# Patient Record
Sex: Female | Born: 2005 | Race: White | Hispanic: No | Marital: Single | Smoking: Never smoker
Health system: Southern US, Community
[De-identification: ages and names within clinical notes are randomized; demographics above are authoritative.]

## PROBLEM LIST (undated history)

## (undated) DIAGNOSIS — F32A Depression, unspecified: Secondary | ICD-10-CM

## (undated) DIAGNOSIS — S82891A Other fracture of right lower leg, initial encounter for closed fracture: Secondary | ICD-10-CM

## (undated) DIAGNOSIS — F419 Anxiety disorder, unspecified: Secondary | ICD-10-CM

## (undated) DIAGNOSIS — J45909 Unspecified asthma, uncomplicated: Secondary | ICD-10-CM

## (undated) HISTORY — DX: Other fracture of right lower leg, initial encounter for closed fracture: S82.891A

## (undated) HISTORY — DX: Depression, unspecified: F32.A

## (undated) HISTORY — DX: Anxiety disorder, unspecified: F41.9

## (undated) HISTORY — DX: Unspecified asthma, uncomplicated: J45.909

---
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
## 2023-02-20 DIAGNOSIS — E66811 Obesity, class 1: Secondary | ICD-10-CM | POA: Insufficient documentation

## 2023-02-20 DIAGNOSIS — H5213 Myopia, bilateral: Secondary | ICD-10-CM | POA: Diagnosis not present

## 2023-02-20 DIAGNOSIS — N921 Excessive and frequent menstruation with irregular cycle: Secondary | ICD-10-CM | POA: Insufficient documentation

## 2023-02-20 DIAGNOSIS — F32A Depression, unspecified: Secondary | ICD-10-CM | POA: Diagnosis not present

## 2023-02-20 DIAGNOSIS — L83 Acanthosis nigricans: Secondary | ICD-10-CM | POA: Diagnosis not present

## 2023-02-20 DIAGNOSIS — F419 Anxiety disorder, unspecified: Secondary | ICD-10-CM | POA: Diagnosis not present

## 2023-02-20 DIAGNOSIS — Z0000 Encounter for general adult medical examination without abnormal findings: Secondary | ICD-10-CM | POA: Diagnosis not present

## 2023-02-22 DIAGNOSIS — S76312A Strain of muscle, fascia and tendon of the posterior muscle group at thigh level, left thigh, initial encounter: Secondary | ICD-10-CM | POA: Diagnosis not present

## 2023-02-22 DIAGNOSIS — S76311A Strain of muscle, fascia and tendon of the posterior muscle group at thigh level, right thigh, initial encounter: Secondary | ICD-10-CM | POA: Diagnosis not present

## 2023-02-27 ENCOUNTER — Other Ambulatory Visit: Payer: Self-pay

## 2023-02-27 DIAGNOSIS — Z5321 Procedure and treatment not carried out due to patient leaving prior to being seen by health care provider: Secondary | ICD-10-CM | POA: Diagnosis not present

## 2023-02-27 DIAGNOSIS — M25562 Pain in left knee: Secondary | ICD-10-CM | POA: Insufficient documentation

## 2023-02-27 NOTE — ED Triage Notes (Signed)
Patient ambulatory with limp on left leg. 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 Pain for 1.5 weeks, followed with Ortho who told her to rest and wear knee brace which is in place but states the pain is only getting worse. Xrays with ortho were negative for fracture. Endorses pain going up the thigh as well but denies numbness/tingling in toes.

## 2023-02-28 ENCOUNTER — Emergency Department
Admission: EM | Admit: 2023-02-28 | Discharge: 2023-02-28 | Discharge status: Against Medical Advice | Payer: Medicaid Other | Attending: Emergency Medicine | Admitting: Emergency Medicine

## 2023-02-28 DIAGNOSIS — M25562 Pain in left knee: Secondary | ICD-10-CM | POA: Diagnosis not present

## 2023-03-03 DIAGNOSIS — R600 Localized edema: Secondary | ICD-10-CM | POA: Diagnosis not present

## 2023-03-03 DIAGNOSIS — M25462 Effusion, left knee: Secondary | ICD-10-CM | POA: Diagnosis not present

## 2023-03-03 DIAGNOSIS — M25562 Pain in left knee: Secondary | ICD-10-CM | POA: Diagnosis not present

## 2023-03-07 DIAGNOSIS — M25562 Pain in left knee: Secondary | ICD-10-CM | POA: Diagnosis not present

## 2023-04-12 DIAGNOSIS — J029 Acute pharyngitis, unspecified: Secondary | ICD-10-CM | POA: Diagnosis not present

## 2023-04-12 DIAGNOSIS — L0291 Cutaneous abscess, unspecified: Secondary | ICD-10-CM | POA: Diagnosis not present

## 2023-04-12 DIAGNOSIS — J4521 Mild intermittent asthma with (acute) exacerbation: Secondary | ICD-10-CM | POA: Diagnosis not present

## 2023-04-17 DIAGNOSIS — N898 Other specified noninflammatory disorders of vagina: Secondary | ICD-10-CM | POA: Diagnosis not present

## 2023-04-17 DIAGNOSIS — N921 Excessive and frequent menstruation with irregular cycle: Secondary | ICD-10-CM | POA: Diagnosis not present

## 2023-05-08 ENCOUNTER — Ambulatory Visit: Payer: Medicaid Other | Admitting: Psychiatry

## 2023-05-08 ENCOUNTER — Encounter: Payer: Self-pay | Admitting: Psychiatry

## 2023-05-08 ENCOUNTER — Other Ambulatory Visit
Admission: RE | Admit: 2023-05-08 | Discharge: 2023-05-08 | Disposition: A | Discharge status: Home / Self Care | Payer: Medicaid Other | Source: Ambulatory Visit | Attending: Psychiatry | Admitting: Psychiatry

## 2023-05-08 VITALS — BP 130/77 | HR 60 | Temp 96.8°F | Ht 65.0 in | Wt 219.0 lb

## 2023-05-08 DIAGNOSIS — F1290 Cannabis use, unspecified, uncomplicated: Secondary | ICD-10-CM | POA: Diagnosis not present

## 2023-05-08 DIAGNOSIS — Z79899 Other long term (current) drug therapy: Secondary | ICD-10-CM | POA: Diagnosis not present

## 2023-05-08 DIAGNOSIS — F411 Generalized anxiety disorder: Secondary | ICD-10-CM | POA: Diagnosis not present

## 2023-05-08 DIAGNOSIS — F32A Depression, unspecified: Secondary | ICD-10-CM | POA: Diagnosis not present

## 2023-05-08 MED ORDER — VENLAFAXINE HCL ER 75 MG PO CP24: 75.0000 mg | ORAL_CAPSULE | Freq: Every day | ORAL | 1 refills | Status: DC
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
## 2023-05-08 NOTE — Patient Instructions (Signed)
Venlafaxine Extended-Release Capsules What is this medication? VENLAFAXINE (VEN la fax een) treats depression and anxiety. It increases the amount of serotonin and norepinephrine in the brain, hormones that help regulate mood. It belongs to a group of medications called SNRIs. This medicine may be used for other purposes; ask your health care provider or pharmacist if you have questions. COMMON BRAND NAME(S): Effexor XR What should I tell my care team before I take this medication? They need to know if you have any of these conditions: Bleeding disorders Glaucoma Heart disease High blood pressure High cholesterol Kidney disease Liver disease Low levels of sodium in the blood Mania or bipolar disorder Seizures Suicidal thoughts, plans, or attempt by you or a family member Take medications that treat or prevent blood clots Thyroid disease An unusual or allergic reaction to venlafaxine, other medications, foods, dyes, or preservatives Pregnant or trying to get pregnant Breastfeeding How should I use this medication? Take this medication by mouth with a full glass of water. Take it as directed on the prescription label. Do not cut, crush, or chew this medication. Take it with food. You may open the capsule and put the contents in 1 teaspoon of applesauce. Swallow the medication and applesauce right away. Do not chew the medication or applesauce. Follow with a glass of water to ensure complete swallowing of the pellets. Try to take your medication at about the same time each day. Do not take your medication more often than directed. Keep taking this medication unless your care team tells you to stop. Stopping it too quickly can cause serious side effects. It can also make your condition worse. A special MedGuide will be given to you by the pharmacist with each prescription and refill. Be sure to read this information carefully each time. Talk to your care team about the use of this medication in  children. Special care may be needed. Overdosage: If you think you have taken too much of this medicine contact a poison control center or emergency room at once. NOTE: This medicine is only for you. Do not share this medicine with others. What if I miss a dose? If you miss a dose, take it as soon as you can. If it is almost time for your next dose, take only that dose. Do not take double or extra doses. What may interact with this medication? Do not take this medication with any of the following: Alcohol Certain medications for fungal infections, such as fluconazole, itraconazole, ketoconazole, posaconazole, voriconazole Cisapride Desvenlafaxine Dronedarone Duloxetine Levomilnacipran Linezolid MAOIs, such as Carbex, Eldepryl, Marplan, Nardil, and Parnate Methylene blue (injected into a vein) Milnacipran Pimozide Thioridazine This medication may also interact with the following: Amphetamines Aspirin and aspirin-like medications Certain medications for mental health conditions Certain medications for migraine headaches, such as almotriptan, eletriptan, frovatriptan, naratriptan, rizatriptan, sumatriptan, zolmitriptan Certain medications for sleep Certain medications that treat or prevent blood clots, such as dalteparin, enoxaparin, warfarin Cimetidine Clozapine Diuretics Fentanyl Furazolidone Indinavir Isoniazid Lithium Metoprolol NSAIDS, medications for pain and inflammation, such as ibuprofen or naproxen Other medications that cause heart rhythm changes Procarbazine Rasagiline Supplements, such as St. John's wort, kava kava, valerian Tramadol Tryptophan This list may not describe all possible interactions. Give your health care provider a list of all the medicines, herbs, non-prescription drugs, or dietary supplements you use. Also tell them if you smoke, drink alcohol, or use illegal drugs. Some items may interact with your medicine. What should I watch for while using  this medication? Tell  your care team if your symptoms do not get better or if they get worse. Visit your care team for regular checks on your progress. Because it may take several weeks to see the full effects of this medication, it is important to continue your treatment as prescribed by your care team. Watch for new or worsening thoughts of suicide or depression. This includes sudden changes in mood, behaviors, or thoughts. These changes can happen at any time but are more common in the beginning of treatment or after a change in dose. Call your care team right away if you experience these thoughts or worsening depression. This medication may cause mood and behavior changes, such as anxiety, nervousness, irritability, hostility, restlessness, excitability, hyperactivity, or trouble sleeping. These changes can happen at any time but are more common in the beginning of treatment or after a change in dose. Call your care team right away if you notice any of these symptoms. This medication can cause an increase in blood pressure. Check with your care team for instructions on monitoring your blood pressure while taking this medication. This medication may affect your coordination, reaction time, or judgment. Do not drive or operate machinery until you know how this medication affects you. Sit up or stand slowly to reduce the risk of dizzy or fainting spells. Drinking alcohol with this medication can increase the risk of these side effects. Your mouth may get dry. Chewing sugarless gum or sucking hard candy and drinking plenty of water may help. Contact your care team if the problem does not go away or is severe. What side effects may I notice from receiving this medication? Side effects that you should report to your care team as soon as possible: Allergic reactions--skin rash, itching, hives, swelling of the face, lips, tongue, or throat Bleeding--bloody or black, tar-like stools, red or dark brown urine,  vomiting blood or brown material that looks like coffee grounds, small, red or purple spots on skin, unusual bleeding or bruising Heart rhythm changes--fast or irregular heartbeat, dizziness, feeling faint or lightheaded, chest pain, trouble breathing Increase in blood pressure Loss of appetite with weight loss Low sodium level--muscle weakness, fatigue, dizziness, headache, confusion Serotonin syndrome--irritability, confusion, fast or irregular heartbeat, muscle stiffness, twitching muscles, sweating, high fever, seizures, chills, vomiting, diarrhea Sudden eye pain or change in vision such as blurry vision, seeing halos around lights, vision loss Thoughts of suicide or self-harm, worsening mood, feelings of depression Side effects that usually do not require medical attention (report to your care team if they continue or are bothersome): Anxiety, nervousness Change in sex drive or performance Dizziness Dry mouth Excessive sweating Nausea Tremors or shaking Trouble sleeping This list may not describe all possible side effects. Call your doctor for medical advice about side effects. You may report side effects to FDA at 1-800-FDA-1088. Where should I keep my medication? Keep out of the reach of children and pets. Store at a controlled temperature between 20 and 25 degrees C (68 degrees and 77 degrees F), in a dry place. Throw away any unused medication after the expiration date. NOTE: This sheet is a summary. It may not cover all possible information. If you have questions about this medicine, talk to your doctor, pharmacist, or health care provider.  2024 Elsevier/Gold Standard (2022-04-21 00:00:00) Cannabis Use Disorder Cannabis use disorder occurs when marijuana use disrupts a person's daily life or causes health problems. This condition can be dangerous. The health problems this condition can cause include: Long-lasting problems with thinking  and learning. These can be permanent in  young people. Mental health problems, such as anxiety disorders, paranoia, psychosis, or schizophrenia. Dangerously high blood pressure and heart rate. Breathing problems and illness, such as bronchitis, emphysema, or lung cancer. Problems with fetal development during pregnancy and child development after pregnancy. People with this condition are also more likely to use other drugs. What are the causes? This condition is caused by using marijuana too much over time. It is not caused by using it only once in a while. Many people with this condition use marijuana because it gives them a feeling of extreme pleasure or relaxation. What increases the risk? The following factors may make a person more likely to develop this condition: Being female. Having a family history of cannabis use disorder. Having mental health issues such as depression or post-traumatic stress disorder (PTSD). What are the signs or symptoms? Symptoms of this condition include: Addiction Using marijuana in greater amounts or for longer periods of time than you want to. Craving marijuana. Spending a lot of time getting marijuana, using it, or recovering from its effects. Having problems at work, at school, at home, or in relationships because of marijuana use. Giving up or cutting down on important life activities because of marijuana use. Using marijuana at times when it is dangerous, such as while you are driving a car. Needing more and more marijuana to get the same desired effect (building up a tolerance). Lack of motivation, known as amotivational syndrome, which leads to poor school and work performance. Physical problems A long-lasting cough. Long-term lung problems and difficulty breathing. Mental problems Hallucinations. Severe anxiety. Trouble sleeping. Increase in violent behavior in young people. Withdrawal problems You may have symptoms when you stop using marijuana. Symptoms include: Irritability or  anger. Anxiety or restlessness. Trouble sleeping. Loss of appetite or weight loss. Aches and pains. Shakiness, sweating, or chills. How is this diagnosed? This condition is diagnosed with an assessment. Your health care provider will ask about your marijuana use and how it affects your life. You will be diagnosed with the condition if you have had at least two symptoms of this condition within a 74-month period. How severe the condition is depends on how many symptoms you have. If you have two to three symptoms, your condition is mild. If you have four to five symptoms, your condition is moderate. If you have six or more symptoms, your condition is severe. A physical exam or lab tests may be done to see if you have physical problems resulting from marijuana use. Your health care provider may also screen for drug use and refer you to a mental health professional for evaluation. How is this treated? Treatment for this condition is usually provided by mental health professionals with training in substance use disorders. Your treatment may involve: Counseling. This treatment is also called talk therapy. It is provided by substance use treatment counselors. A counselor can address the reasons you use marijuana and suggest ways to keep you from using it again. The goals of talk therapy are to: Find healthy activities to replace using marijuana. Identify and avoid the things that trigger your marijuana use. Help you learn how to handle cravings. Support groups. Support groups are led by people who have quit using marijuana. They provide emotional support, advice, and guidance. Medicine. Medicine is used to treat mental health issues that trigger marijuana use or that result from it. Follow these instructions at home: Lifestyle Make healthy lifestyle choices, such as: Eating a  healthy diet. Getting enough exercise. Learning skills for managing stress.  General instructions Take over-the-counter  and prescription medicines, as well as any herbal remedies, only as told by your health care provider. Check with your health care provider before starting any new medicines. Work with Photographer or group to develop tools to keep you from using marijuana again (relapsing). Learn daily living skills and work Programmer, applications. Where to find more information Centers for Disease Control and Prevention (CDC): TonerPromos.no Substance Abuse and Mental Health Services Administration: RockToxic.pl Contact a health care provider if: You are not able to take your medicines as told. Your symptoms get worse. Get help right away if: You have serious thoughts about hurting yourself or others. Get help right away if you feel like you may hurt yourself or others, or have thoughts about taking your own life. Go to your nearest emergency room or: Call 911. Call the National Suicide Prevention Lifeline at (860)323-5580 or 988. This is open 24 hours a day. Text the Crisis Text Line at 707-363-6299. This information is not intended to replace advice given to you by your health care provider. Make sure you discuss any questions you have with your health care provider. Document Revised: 09/29/2021 Document Reviewed: 09/29/2021 Elsevier Patient Education  2024 [employer].

## 2023-05-12 LAB — URINE DRUGS OF ABUSE SCREEN W ALC, ROUTINE (REF LAB)
Amphetamines, Urine: NEGATIVE ng/mL
Barbiturate, Ur: NEGATIVE ng/mL
Benzodiazepine Quant, Ur: NEGATIVE ng/mL
Cocaine (Metab.): NEGATIVE ng/mL
Ethanol U, Quan: NEGATIVE %
Methadone Screen, Urine: NEGATIVE ng/mL
Opiate Quant, Ur: NEGATIVE ng/mL
Phencyclidine, Ur: NEGATIVE ng/mL
Propoxyphene, Urine: NEGATIVE ng/mL

## 2023-05-12 LAB — PANEL 799049
CARBOXY THC GC/MS CONF: 128 ng/mL
Cannabinoid GC/MS, Ur: POSITIVE — AB

## 2023-05-17 ENCOUNTER — Ambulatory Visit: Payer: Medicaid Other | Admitting: Professional Counselor

## 2023-05-17 DIAGNOSIS — F411 Generalized anxiety disorder: Secondary | ICD-10-CM
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
## 2023-05-24 ENCOUNTER — Ambulatory Visit: Payer: Medicaid Other | Admitting: Professional Counselor

## 2023-05-24 DIAGNOSIS — F411 Generalized anxiety disorder: Secondary | ICD-10-CM

## 2023-05-24 NOTE — Progress Notes (Signed)
   THERAPIST PROGRESS NOTE  Session Time: 1:15 PM - 2:00 PM  Participation Level: Active  Behavioral Response: Casual, Alert, Anxious  Type of Therapy: Individual Therapy  Treatment Goals addressed: Active  Anxiety  LTG: "I want to figure out what's wrong with me and why I think the way I think. I'm very impulsive. I want to find solutions to my problems without spiraling over things. Just figuring out my brain and why I feel the way I feel."     Start:  05/24/23    Expected End:  05/22/24     STG: Report a decrease in anxiety and depression symptoms as evidenced by an overall reduction in score by a minimum of 25% on the GAD-7 and PHQ-9   STG: "Just trying to get a grasp of my emotions and why I'm so moody and all over the place." Improve emotional intelligence AEB ability to identify emotions and implement emotion regulation skills and cognitive restructuring over the next 12 weeks.    Demonstrates progress in stages of grief at own pace   ProgressTowards Goals: Initial  Interventions: Motivational Interviewing  Summary: Lindsey Morrison is a 18 y.o. female who presents with GAD. She appears alert, anxious, and oriented x5. She reports things have been going okay. She didn't start nail school as planned because she will be attending a different school starting in February. [name] engaged in developing her treatment plan and talked about her stressors with family and grief. She expressed understanding of homework assignment.   Therapist Response: Conducted session with [name]. Began session with check-in/update since previous session. Used open-ended questions to facilitate discussion. Utilized empathetic and reflective listening. Developed treatment plan with input from [name] on current strengths, needs, and progress towards goals. Actively listened and asked clarifying questions as [name] discussed family conflict and grief. Assigned homework to write an impact statement about the loss  of her grandfather and uncle. Confirmed next session and concluded session.   Suicidal/Homicidal: No  Plan: Return again in 2 weeks.  Diagnosis: GAD (generalized anxiety disorder)  Collaboration of Care: Medication Management AEB chart review  Patient/Guardian was advised Release of Information must be obtained prior to any record release in order to collaborate their care with an outside provider. Patient/Guardian was advised if they have not already done so to contact the registration department to sign all necessary forms in order for us  to release information regarding their care.   Consent: Patient/Guardian gives verbal consent for treatment and assignment of benefits for services provided during this visit. Patient/Guardian expressed understanding and agreed to proceed.   Len Quale, [facility] 05/24/2023

## 2023-06-05 ENCOUNTER — Ambulatory Visit (INDEPENDENT_AMBULATORY_CARE_PROVIDER_SITE_OTHER): Payer: Medicaid Other | Admitting: Professional Counselor

## 2023-06-05 DIAGNOSIS — F411 Generalized anxiety disorder: Secondary | ICD-10-CM

## 2023-06-05 NOTE — Progress Notes (Unsigned)
   THERAPIST PROGRESS NOTE  Session Time: 4:00 PM - 4:55 PM  Participation Level: Active  Behavioral Response: CasualAlertEuthymic  Type of Therapy: Individual Therapy  Treatment Goals addressed: Active Anxiety  LTG: "I want to figure out what's wrong with me and why I think the way I think. I'm very impulsive. I want to find solutions to my problems without spiraling over things. Just figuring out my brain and why I feel the way I feel."                 Start:  05/24/23    Expected End:  05/22/24      STG: Report a decrease in anxiety and depression symptoms as evidenced by an overall reduction in score by a minimum of 25% on the GAD-7 and PHQ-9    STG: "Just trying to get a grasp of my emotions and why I'm so moody and all over the place." Improve emotional intelligence AEB ability to identify emotions and implement emotion regulation skills and cognitive restructuring over the next 12 weeks.     Demonstrates progress in stages of grief at own pace   ProgressTowards Goals: Progressing  Interventions: CBT, DBT, and Motivational Interviewing  Summary: Lindsey Morrison is a 18 y.o. female who presents with ***.   Suicidal/Homicidal: No  Therapist Response: Verbal impact statement, Donut, STOP/SOS, Exposure hierarchy hwy driving  Plan: Return again in 2 weeks.  Diagnosis: GAD (generalized anxiety disorder)  Collaboration of Care: Medication Management AEB ***  Patient/Guardian was advised Release of Information must be obtained prior to any record release in order to collaborate their care with an outside provider. Patient/Guardian was advised if they have not already done so to contact the registration department to sign all necessary forms in order for [place] to release information regarding their care.   Consent: Patient/Guardian gives verbal consent for treatment and assignment of benefits for services provided during this visit. Patient/Guardian expressed understanding and  agreed to proceed.   Edmonia Lynch, [facility] 06/05/2023

## 2023-06-16 DIAGNOSIS — J4521 Mild intermittent asthma with (acute) exacerbation: Secondary | ICD-10-CM | POA: Diagnosis not present

## 2023-06-16 DIAGNOSIS — R6889 Other general symptoms and signs: Secondary | ICD-10-CM | POA: Diagnosis not present

## 2023-06-16 DIAGNOSIS — J069 Acute upper respiratory infection, unspecified: Secondary | ICD-10-CM | POA: Diagnosis not present

## 2023-06-16 DIAGNOSIS — Z03818 Encounter for observation for suspected exposure to other biological agents ruled out: Secondary | ICD-10-CM | POA: Diagnosis not present

## 2023-06-23 ENCOUNTER — Ambulatory Visit: Payer: Medicaid Other | Admitting: Professional Counselor

## 2023-06-23 DIAGNOSIS — F411 Generalized anxiety disorder: Secondary | ICD-10-CM

## 2023-06-23 NOTE — Progress Notes (Signed)
  THERAPIST PROGRESS NOTE  Session Time: 9:53 AM - 10:43 AM   Participation Level: Active  Behavioral Response: CasualAlertEuthymic  Type of Therapy: Individual Therapy  Treatment Goals addressed: Active Anxiety  LTG: "I want to figure out what's wrong with me and why I think the way I think. I'm very impulsive. I want to find solutions to my problems without spiraling over things. Just figuring out my brain and why I feel the way I feel."                 Start:  05/24/23    Expected End:  05/22/24      STG: Report a decrease in anxiety and depression symptoms as evidenced by an overall reduction in score by a minimum of 25% on the GAD-7 and PHQ-9    STG: "Just trying to get a grasp of my emotions and why I'm so moody and all over the place." Improve emotional intelligence AEB ability to identify emotions and implement emotion regulation skills and cognitive restructuring over the next 12 weeks.     Demonstrates progress in stages of grief at own pace   ProgressTowards Goals: Progressing  Interventions: CBT and Motivational Interviewing  Summary: Lindsey Morrison is a 18 y.o. female who presents with a history of GAD and depression. She appeared alert and oriented x5. She was casually dressed and appropriately groomed. She reported she had the flu last week and was unable to make the drive to [place]. She sold her tickets and plans to use that money for something else. 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 Lindsey Morrison reported she still isn't driving on the highway. She stated she just needs to do it and get the exposure. She will try today. Lindsey Morrison discussed other situations and her fear of being judged. She identified this is outside of her control and ultimately other's judgements rarely impact her life and if they do she can problem solve the situation at that time. Lindsey Morrison was receptive to positive affirmation and put it in her phone to  remember to practice. She showed great improvements on her anxiety and depression screening, scoring only mild symptoms this week. She was receptive to being seen again in a month.   Therapist Response: Conducted session with Lindsey Morrison. Began session with check-in/update since previous session. Utilized empathetic and reflective listening. Discussed exposure to driving on the highway. Explored Lindsey Morrison's thoughts around being judged by others. Reminded Lindsey Morrison to focus on what's within her control. Provided an affirmation for Lindsey Morrison to practice to balance acceptance of self and hope for progress/change, "I'm doing the best I can and I want to be doing better." Administered GAD7 and PHQ9 screening. Prasied Lindsey Morrison for reduction in symptoms. Scheduled follow-up appointment and concluded session.   Suicidal/Homicidal: No  Plan: Return again in 4 weeks.  Diagnosis: GAD (generalized anxiety disorder)  Collaboration of Care: Medication Management AEB chart review  Patient/Guardian was advised Release of Information must be obtained prior to any record release in order to collaborate their care with an outside provider. Patient/Guardian was advised if they have not already done so to contact the registration department to sign all necessary forms in order for [place] to release information regarding their care.   Consent: Patient/Guardian gives verbal consent for treatment and assignment of benefits for services provided during this visit. Patient/Guardian expressed understanding and agreed to proceed.   Edmonia Lynch, [facility] 06/23/2023

## 2023-06-26 ENCOUNTER — Encounter: Payer: Self-pay | Admitting: Psychiatry

## 2023-06-26 ENCOUNTER — Ambulatory Visit (INDEPENDENT_AMBULATORY_CARE_PROVIDER_SITE_OTHER): Payer: Medicaid Other | Admitting: Psychiatry

## 2023-06-26 VITALS — BP 118/80 | HR 72 | Temp 97.7°F | Ht 65.0 in | Wt 224.4 lb

## 2023-06-26 DIAGNOSIS — F411 Generalized anxiety disorder: Secondary | ICD-10-CM

## 2023-06-26 DIAGNOSIS — F321 Major depressive disorder, single episode, moderate: Secondary | ICD-10-CM | POA: Diagnosis not present

## 2023-06-26 MED ORDER — HYDROXYZINE HCL 25 MG PO TABS: 25.0000 mg | ORAL_TABLET | Freq: Two times a day (BID) | ORAL | 1 refills | Status: AC | PRN

## 2023-06-26 MED ORDER — VENLAFAXINE HCL ER 150 MG PO CP24: 150.0000 mg | ORAL_CAPSULE | Freq: Every day | ORAL | 1 refills | Status: DC

## 2023-06-26 NOTE — Progress Notes (Signed)
 [facility] MD OP Progress Note  06/26/2023 11:30 AM Lindsey Morrison  MRN:  [id number]  Chief Complaint:  Chief Complaint  Patient presents with   Follow-up   Anxiety   Depression   Medication Refill   HPI: Lindsey Morrison is a 18 year old biracial female, single, has a history of GAD, depression, asthma, menstrual irregularities, was evaluated in office today, presented for follow-up appointment.  She has a history of generalized anxiety and unspecified depression. Previously, she was on venlafaxine 75 mg, which was effective until she had to temporarily discontinue it due to taking Tamiflu for the flu. After resuming venlafaxine following a one-week break, her depression symptoms improved, but her anxiety remains high. She experiences stress in situations that should not be stressful, accompanied by a racing heart. She has not been able to use hydroxyzine as needed because it was unavailable.  Her depression symptoms fluctuate, with recent exacerbations around the anniversaries of her grandfather's and uncle's deaths. She feels very sad, has difficulty getting out of bed, and isolates herself during these times.  Denies suicidal thoughts or self-injurious behaviors are present. Although not formally diagnosed with major depression, she has shown symptoms in the past. 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 She reports making friends and achieving good grades, which has been a positive change. This routine has also helped her manage her medications better, including her birth control. She reports sleeping well since starting school, going to bed around 9-10 PM and waking up at 6-6:30 AM. Her appetite decreased during the flu but is now improving, and she has not experienced significant weight loss. She has stopped using cannabis since  having the flu and has not resumed it.  She is currently in psychotherapy with Ms. Janina Mayo and therapy sessions have been beneficial.  She is motivated to stay in therapy.   Visit Diagnosis:    ICD-10-CM   1. GAD (generalized anxiety disorder)  F41.1 venlafaxine XR (EFFEXOR-XR) 150 MG 24 hr capsule    hydrOXYzine (ATARAX) 25 MG tablet    2. Current moderate episode of major depressive disorder without prior episode (HCC)  F32.1 venlafaxine XR (EFFEXOR-XR) 150 MG 24 hr capsule      Past Psychiatric History: I have reviewed past psychiatric history from progress note on 05/08/2023.  Past trials of medications like Zoloft-made her worse, Prozac, Lexapro, Trazodone  Past Medical History:  Past Medical History:  Diagnosis Date   Ankle fracture, right    Anxiety    Asthma    Depression    History reviewed. No pertinent surgical history.  Family Psychiatric History: I have reviewed family psychiatric history from progress note on 05/08/2023  Family History:  Family History  Problem Relation Age of Onset   Depression Mother    Anxiety disorder Mother    Drug abuse Mother    Drug abuse Father    Drug abuse Maternal Uncle    Suicidality Maternal Uncle    Schizophrenia Other     Social History: Reviewed social history from progress note on 05/08/2023 Social History   Socioeconomic History   Marital status: Single    Spouse name: Not on file   Number of children: Not on file   Years of education: Not on file   Highest education level: High school graduate  Occupational History   Not on file  Tobacco Use   Smoking status: Never   Smokeless tobacco: Never  Vaping Use   Vaping status: Every Day   Substances: Nicotine, Flavoring  Substance and Sexual Activity   Alcohol use: Not Currently   Drug use: Yes    Types: Marijuana   Sexual activity: Yes    Birth control/protection: Pill, Condom  Other Topics Concern   Not on file  Social History Narrative   Not on  file   Social Drivers of Health   Financial Resource Strain: Low Risk  (05/17/2023)   Overall Financial Resource Strain (CARDIA)    Difficulty of Paying Living Expenses: Not hard at all  Recent Concern: Financial Resource Strain - Medium Risk (02/20/2023)   [facility]   Overall Financial Resource Strain (CARDIA)    Difficulty of Paying Living Expenses: Somewhat hard  Food Insecurity: No Food Insecurity (05/17/2023)   Hunger Vital Sign    Worried About Running Out of Food in the Last Year: Never true    Ran Out of Food in the Last Year: Never true  Transportation Needs: No Transportation Needs (05/17/2023)   PRAPARE - Administrator, Civil Service (Medical): No    Lack of Transportation (Non-Medical): No  Physical Activity: Sufficiently Active (05/17/2023)   Exercise Vital Sign    Days of Exercise per Week: 3 days    Minutes of Exercise per Session: 60 min  Stress: Stress Concern Present (05/17/2023)   [employer] of Occupational Health - Occupational Stress Questionnaire    Feeling of Stress : To some extent  Social Connections: Moderately Isolated (05/17/2023)   Social Connection and Isolation Panel [NHANES]    Frequency of Communication with Friends and Family: More than three times a week    Frequency of Social Gatherings with Friends and Family: More than three times a week    Attends Religious Services: 1 to 4 times per year    Active Member of [employer] or Organizations: No    Attends Banker Meetings: Never    Marital Status: Never married    Allergies:  Allergies  Allergen Reactions   Cat Dander Shortness Of Breath   Amoxicillin Rash    Metabolic Disorder Labs: No results found for: "HGBA1C", "MPG" No results found for: "PROLACTIN" No results found for: "CHOL", "TRIG", "HDL", "CHOLHDL", "VLDL", "LDLCALC" No results found for: "TSH"  Therapeutic Level Labs: No results found for: "LITHIUM" No results found for:  "VALPROATE" No results found for: "CBMZ"  Current Medications: Current Outpatient Medications  Medication Sig Dispense Refill   albuterol (VENTOLIN HFA) 108 (90 Base) MCG/ACT inhaler Inhale into the lungs.     cetirizine (ZYRTEC) 10 MG tablet Take 10 mg by mouth daily.     fluticasone (FLONASE) 50 MCG/ACT nasal spray Place into the nose.     norgestimate-ethinyl estradiol (MILI) 0.25-35 MG-MCG tablet Take 1 tablet by mouth daily.     sodium fluoride (FLUORISHIELD) 1.1 % GEL dental gel BRUSH ONCE DAILY IN PLACE OF REGULAR TOOTHPASTE     venlafaxine XR (EFFEXOR-XR) 150 MG 24 hr capsule Take 1 capsule (150 mg total) by mouth daily with breakfast. 30 capsule 1   hydrOXYzine (ATARAX) 25 MG tablet Take 1 tablet (25 mg total) by mouth 2 (two) times daily as needed. For severe anxiety attacks , please limit use 60 tablet 1   No current facility-administered medications for this visit.     Musculoskeletal: Strength & Muscle Tone: within normal limits Gait &  Station: normal Patient leans: N/A  Psychiatric Specialty Exam: Review of Systems  Psychiatric/Behavioral:  Positive for dysphoric mood. The patient is nervous/anxious.     Blood pressure 118/80, pulse 72, temperature 97.7 F (36.5 C), temperature source Temporal, height 5\' 5"  (1.651 m), weight 224 lb 6.4 oz (101.8 kg), last menstrual period 06/05/2023, SpO2 100%.Body mass index is 37.34 kg/m.  General Appearance: Casual  Eye Contact:  Fair  Speech:  Clear and Coherent  Volume:  Normal  Mood:  Anxious and Depressed  Affect:  Congruent  Thought Process:  Goal Directed and Descriptions of Associations: Intact  Orientation:  Full (Time, Place, and Person)  Thought Content: Logical   Suicidal Thoughts:  No  Homicidal Thoughts:  No  Memory:  Immediate;   Fair Recent;   Fair Remote;   Fair  Judgement:  Fair  Insight:  Fair  Psychomotor Activity:  Normal  Concentration:  Concentration: Fair and Attention Span: Fair  Recall:  [employer] of Knowledge: Fair  Language: Fair  Akathisia:  No  Handed:  Right  AIMS (if indicated): not done  Assets:  Desire for Improvement Housing Social Support Transportation  ADL's:  Intact  Cognition: WNL  Sleep:  Fair   Screenings: GAD-7    Advertising copywriter from 06/23/2023 in [facility] Counselor from 05/17/2023 in [facility] Office Visit from 05/08/2023 in [facility]  Total GAD-7 Score 6 19 21       PHQ2-9    Flowsheet Row Office Visit from 06/26/2023 in [facility] Counselor from 06/23/2023 in [facility] Counselor from 05/17/2023 in [facility] Office Visit from 05/08/2023 in [facility]  PHQ-2 Total Score 3 2 6 6   PHQ-9 Total Score 16 6 26 22       Flowsheet Row Office Visit from 06/26/2023 in [facility] Counselor from 05/17/2023 in [facility] Office Visit from 05/08/2023 in [facility]  C-SSRS RISK CATEGORY No Risk Low Risk No Risk        Assessment and Plan: SHARMILA WROBLESKI is a 18 year old biracial female with history of mood symptoms including anxiety, depression, presented for a follow-up appointment.  Discussed assessment and plan as noted below.  Generalized Anxiety Disorder-unstable Persistent high anxiety levels despite Venlafaxine 75 mg, exacerbated by recent interruption due to flu and Tamiflu treatment. Reports heart racing and stress in non-stressful situations. Therapy with Lanora Manis is effective. - Increase Venlafaxine to 150 mg daily - Continue Hydroxyzine 25 mg as needed, up to twice daily, with option to take half tablet to avoid daytime  drowsiness - Continue therapy with Ms.Elizabeth Gainey  Major depressive disorder-unstable Fluctuating depressive symptoms with improvement on venlafaxine and therapy. Recent exacerbation around anniversaries of family members' deaths. No suicidal ideation or self-injurious behavior reported. - Increase Venlafaxine to 150 mg daily - Continue therapy with Ms.Elizabeth Gainey  Labs-reviewed urine drug screen-dated 05/08/2023-negative.  Follow-up - Schedule follow-up video visit in six weeks.  Collaboration of Care: Collaboration of Care: Referral or follow-up with counselor/therapist AEB patient encouraged to continue CBT  Patient/Guardian was advised Release of Information must be obtained prior to any record release in order to collaborate their care with an outside provider. Patient/Guardian was advised if they have not already done so to contact the registration department  to sign all necessary forms in order for [place] to release information regarding their care.   Consent: Patient/Guardian gives verbal consent for treatment and assignment of benefits for services provided during this visit. Patient/Guardian expressed understanding and agreed to proceed.   This note was generated in part or whole with voice recognition software. Voice recognition is usually quite accurate but there are transcription errors that can and very often do occur. I apologize for any typographical errors that were not detected and corrected.    Jomarie Longs, MD 06/26/2023, 11:30 AM

## 2023-06-30 DIAGNOSIS — M222X2 Patellofemoral disorders, left knee: Secondary | ICD-10-CM | POA: Diagnosis not present

## 2023-06-30 DIAGNOSIS — M25562 Pain in left knee: Secondary | ICD-10-CM | POA: Diagnosis not present

## 2023-07-07 DIAGNOSIS — M25562 Pain in left knee: Secondary | ICD-10-CM | POA: Diagnosis not present

## 2023-07-07 DIAGNOSIS — M222X2 Patellofemoral disorders, left knee: Secondary | ICD-10-CM | POA: Diagnosis not present

## 2023-07-24 ENCOUNTER — Encounter: Payer: Self-pay | Admitting: Professional Counselor

## 2023-07-24 ENCOUNTER — Ambulatory Visit (INDEPENDENT_AMBULATORY_CARE_PROVIDER_SITE_OTHER): Payer: Medicaid Other | Admitting: Professional Counselor

## 2023-07-24 DIAGNOSIS — F411 Generalized anxiety disorder: Secondary | ICD-10-CM | POA: Diagnosis not present

## 2023-07-24 NOTE — Progress Notes (Signed)
   THERAPIST PROGRESS NOTE  Session Time: 8:05 AM - 8:59 AM  Participation Level: Active  Behavioral Response: Casual, Alert, Euthymic  Type of Therapy: Individual Therapy  Treatment Goals addressed: Active Anxiety  LTG: "I want to figure out what's wrong with me and why I think the way I think. I'm very impulsive. I want to find solutions to my problems without spiraling over things. Just figuring out my brain and why I feel the way I feel."                 Start:  05/24/23    Expected End:  05/22/24      STG: Report a decrease in anxiety and depression symptoms as evidenced by an overall reduction in score by a minimum of 25% on the GAD-7 and PHQ-9    STG: "Just trying to get a grasp of my emotions and why I'm so moody and all over the place." Improve emotional intelligence AEB ability to identify emotions and implement emotion regulation skills and cognitive restructuring over the next 12 weeks.     Demonstrates progress in stages of grief at own pace   ProgressTowards Goals: Progressing  Interventions: Motivational Interviewing and CBT  Summary: Lindsey Morrison is a 18 y.o. female who presents with a history of anxiety and depression. She stated things are going okay. 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 She noted a pair of friends recently left the group chat and won't respond to messages. Lindsey Morrison noted she has been speculating and over-thinking about why. She actively listened to [facility] MAN skill and practiced in session. Lindsey Morrison also discussed her Laney Potash wants to give away some of their animals and that upsets her. She practiced DEAR MAN again with this situation. Lindsey Morrison provided updates about school and potential plans for after graduation.  Therapist Response: Conducted session with Lindsey Morrison. Began session with check-in/update since previous session. Utilized empathetic and reflective listening. Explained DEAR MAN skill and had Lindsey Morrison practice in  session. Provided feedback and support. Engaged in discussion about school/plans after graduation, using open-ended questions and summarizing Lindsey Morrison's thoughts/feelings. Scheduled additional appointment and concluded session.   Suicidal/Homicidal: No  Plan: Return again in 2 weeks.  Diagnosis: GAD (generalized anxiety disorder)  Collaboration of Care: Medication Management AEB chart review  Patient/Guardian was advised Release of Information must be obtained prior to any record release in order to collaborate their care with an outside provider. Patient/Guardian was advised if they have not already done so to contact the registration department to sign all necessary forms in order for [place] to release information regarding their care.   Consent: Patient/Guardian gives verbal consent for treatment and assignment of benefits for services provided during this visit. Patient/Guardian expressed understanding and agreed to proceed.   Edmonia Lynch, [facility] 07/24/2023

## 2023-07-25 DIAGNOSIS — M25562 Pain in left knee: Secondary | ICD-10-CM | POA: Diagnosis not present

## 2023-07-25 DIAGNOSIS — M222X2 Patellofemoral disorders, left knee: Secondary | ICD-10-CM | POA: Diagnosis not present

## 2023-07-28 DIAGNOSIS — M25562 Pain in left knee: Secondary | ICD-10-CM | POA: Diagnosis not present

## 2023-07-28 DIAGNOSIS — M222X2 Patellofemoral disorders, left knee: Secondary | ICD-10-CM | POA: Diagnosis not present

## 2023-08-04 ENCOUNTER — Encounter: Payer: Self-pay | Admitting: Psychiatry

## 2023-08-04 ENCOUNTER — Telehealth: Payer: Medicaid Other | Admitting: Psychiatry

## 2023-08-04 DIAGNOSIS — F411 Generalized anxiety disorder: Secondary | ICD-10-CM | POA: Diagnosis not present

## 2023-08-04 DIAGNOSIS — J029 Acute pharyngitis, unspecified: Secondary | ICD-10-CM | POA: Diagnosis not present

## 2023-08-04 DIAGNOSIS — F321 Major depressive disorder, single episode, moderate: Secondary | ICD-10-CM

## 2023-08-04 DIAGNOSIS — R1032 Left lower quadrant pain: Secondary | ICD-10-CM | POA: Diagnosis not present

## 2023-08-04 DIAGNOSIS — R1031 Right lower quadrant pain: Secondary | ICD-10-CM | POA: Diagnosis not present

## 2023-08-04 MED ORDER — BUPROPION HCL 75 MG PO TABS: 75.0000 mg | ORAL_TABLET | Freq: Every morning | ORAL | 1 refills | Status: DC

## 2023-08-04 NOTE — Patient Instructions (Signed)
Bupropion Tablets (Depression/Mood Disorders) What is this medication? BUPROPION (byoo PROE pee on) treats depression. It increases norepinephrine and dopamine in the brain, hormones that help regulate mood. It belongs to a group of medications called NDRIs. This medicine may be used for other purposes; ask your health care provider or pharmacist if you have questions. COMMON BRAND NAME(S): Wellbutrin What should I tell my care team before I take this medication? They need to know if you have any of these conditions: An eating disorder, such as anorexia or bulimia Bipolar disorder or psychosis Diabetes or high blood sugar, treated with medication Glaucoma Head injury or brain tumor Heart disease, previous heart attack, or irregular heart beat High blood pressure Kidney disease Liver disease Seizures Suicidal thoughts, plans, or attempt by you or a family member Tourette syndrome Weight loss An unusual or allergic reaction to bupropion, other medications, foods, dyes, or preservatives Pregnant or trying to become pregnant Breastfeeding How should I use this medication? Take this medication by mouth with a glass of water. Follow the directions on the prescription label. You can take it with or without food. If it upsets your stomach, take it with food. Take your medication at regular intervals. Do not take your medication more often than directed. Do not stop taking this medication suddenly except upon the advice of your care team. Stopping this medication too quickly may cause serious side effects or your condition may worsen. A special MedGuide will be given to you by the pharmacist with each prescription and refill. Be sure to read this information carefully each time. Talk to your care team regarding the use of this medication in children. Special care may be needed. Overdosage: If you think you have taken too much of this medicine contact a poison control center or emergency room at  once. NOTE: This medicine is only for you. Do not share this medicine with others. What if I miss a dose? If you miss a dose, take it as soon as you can. If it is less than four hours to your next dose, take only that dose and skip the missed dose. Do not take double or extra doses. What may interact with this medication? Do not take this medication with any of the following: Linezolid MAOIs, such as Azilect, Carbex, Eldepryl, Marplan, Nardil, and Parnate Methylene blue (injected into a vein) Other medications that contain bupropion, such as Zyban This medication may also interact with the following: Alcohol Certain medications for anxiety or sleep Certain medications for blood pressure, such as metoprolol, propranolol Certain medications for HIV or AIDS, such as efavirenz, lopinavir, nelfinavir, ritonavir Certain medications for irregular heartbeat, such as propafenone, flecainide Certain medications for mental health conditions Certain medications for Parkinson disease, such as amantadine, levodopa Certain medications for seizures, such as carbamazepine, phenytoin, phenobarbital Cimetidine Clopidogrel Cyclophosphamide Digoxin Furazolidone Isoniazid Nicotine Orphenadrine Procarbazine Steroid medications, such as prednisone or cortisone Stimulant medications for attention disorders, weight loss, or to stay awake Tamoxifen Theophylline Thiotepa Ticlopidine Tramadol Warfarin This list may not describe all possible interactions. Give your health care provider a list of all the medicines, herbs, non-prescription drugs, or dietary supplements you use. Also tell them if you smoke, drink alcohol, or use illegal drugs. Some items may interact with your medicine. What should I watch for while using this medication? Tell your care team if your symptoms do not get better or if they get worse. Visit your care team for regular checks on your progress. Because it may take several  weeks to see  the full effects of this medication, it is important to continue your treatment as prescribed. This medication may cause thoughts of suicide or depression. This includes sudden changes in mood, behaviors, or thoughts. These changes can happen at any time but are more common in the beginning of treatment or after a change in dose. Call your care team right away if you experience these thoughts or worsening depression. This medication may cause mood and behavior changes, such as anxiety, nervousness, irritability, hostility, restlessness, excitability, hyperactivity, or trouble sleeping. These changes can happen at any time but are more common in the beginning of treatment or after a change in dose. Call your care team right away if you notice any of these symptoms. This medication may cause serious skin reactions. They can happen weeks to months after starting the medication. Contact your care team right away if you notice fevers or flu-like symptoms with a rash. The rash may be red or purple and then turn into blisters or peeling of the skin. You may also notice a red rash with swelling of the face, lips, or lymph nodes in your neck or under your arms. Avoid drinks that contain alcohol while taking this medication. Drinking large amounts of alcohol, using sleeping or anxiety medications, or quickly stopping the use of these agents while taking this medication may increase your risk for a seizure. This medication may affect your coordination, reaction time, or judgment. Do not drive or operate machinery until you know how this medication affects you. Do not take this medication close to bedtime. It may prevent you from sleeping. Your mouth may get dry. Chewing sugarless gum or sucking hard candy and drinking plenty of water may help. Contact your care team if the problem does not go away or is severe. What side effects may I notice from receiving this medication? Side effects that you should report to your  care team as soon as possible: Allergic reactions--skin rash, itching, hives, swelling of the face, lips, tongue, or throat Increase in blood pressure Mood and behavior changes--anxiety, nervousness, confusion, hallucinations, irritability, hostility, thoughts of suicide or self-harm, worsening mood, feelings of depression Redness, blistering, peeling, or loosening of the skin, including inside the mouth Seizures Sudden eye pain or change in vision such as blurry vision, seeing halos around lights, vision loss Side effects that usually do not require medical attention (report to your care team if they continue or are bothersome): Constipation Dizziness Dry mouth Loss of appetite Nausea Tremors or shaking Trouble sleeping This list may not describe all possible side effects. Call your doctor for medical advice about side effects. You may report side effects to FDA at 1-800-FDA-1088. Where should I keep my medication? Keep out of the reach of children and pets. Store at room temperature between 20 and 25 degrees C (68 and 77 degrees F), away from direct sunlight and moisture. Keep tightly closed. Throw away any unused medication after the expiration date. NOTE: This sheet is a summary. It may not cover all possible information. If you have questions about this medicine, talk to your doctor, pharmacist, or health care provider.  2024 Elsevier/Gold Standard (2022-01-16 00:00:00)

## 2023-08-04 NOTE — Progress Notes (Signed)
 Virtual Visit via Video Note  I connected with Lindsey Morrison on 08/04/23 at 10:00 AM EDT by a video enabled telemedicine application and verified that I am speaking with the correct person using two identifiers.  Location Provider Location : [facility] Patient Location : Home  Participants: Patient , Provider I discussed the limitations of evaluation and management by telemedicine and the availability of in person appointments. The patient expressed understanding and agreed to proceed.   I discussed the assessment and treatment plan with the patient. The patient was provided an opportunity to ask questions and all were answered. The patient agreed with the plan and demonstrated an understanding of the instructions.   The patient was advised to call back or seek an in-person evaluation if the symptoms worsen or if the condition fails to improve as anticipated.   BH MD OP Progress Note  08/04/2023 2:07 PM Lindsey Morrison  MRN:  [id number]  Chief Complaint:  Chief Complaint  Patient presents with   Follow-up   Depression   Anxiety   Medication Refill   HPI: Lindsey Morrison is a 18 year old biracial female, single, has a history of GAD, depression, asthma, menstrual irregularities was evaluated by telemedicine today.  She has experienced increased depressive symptoms since starting venlafaxine, including persistent sadness over the past few months. The medication has not been as effective as anticipated. She feels the medication has shifted her from being more anxious to more depressed.  She however denies any suicidality.   Her anxiety has improved with the current 150 mg dose of venlafaxine. Anxiety is now mostly triggered by significant events, such as school tests, whereas previously, she experienced anxiety in everyday situations like driving or going to the store. On the 75 mg dose, she had frequent panic attacks despite having hydroxyzine available as needed, which she avoided  using.  She reports an improved ability to get out of bed and engage in social interactions. She is able to attend school on time and socialize with friends without feeling drained. She sleeps well at night and only sleeps in on weekends due to early weekday wake-ups.  She has a history of eating issues, including skipping meals during middle school and episodes of binge eating for comfort. She describes herself as a picky eater and is actively trying to regulate her eating habits by trying new foods.   Visit Diagnosis:    ICD-10-CM   1. GAD (generalized anxiety disorder)  F41.1     2. Current moderate episode of major depressive disorder without prior episode (HCC)  F32.1 buPROPion (WELLBUTRIN) 75 MG tablet      Past Psychiatric History: I have reviewed past psychiatric history from progress note on 05/08/2023.  Past trials of medications like Zoloft-made her worse, Prozac, Lexapro, trazodone.  Past Medical History:  Past Medical History:  Diagnosis Date   Ankle fracture, right    Anxiety    Asthma    Depression    History reviewed. No pertinent surgical history.  Family Psychiatric History: I have reviewed family psychiatric history from progress note on 05/08/2023.  Family History:  Family History  Problem Relation Age of Onset   Depression Mother    Anxiety disorder Mother    Drug abuse Mother    Drug abuse Father    Drug abuse Maternal Uncle    Suicidality Maternal Uncle    Schizophrenia Other     Social History: I have reviewed social history from progress note on 05/08/2023. Social History  Socioeconomic History   Marital status: Single    Spouse name: Not on file   Number of children: Not on file   Years of education: Not on file   Highest education level: High school graduate  Occupational History   Not on file  Tobacco Use   Smoking status: Never   Smokeless tobacco: Never  Vaping Use   Vaping status: Every Day   Substances: Nicotine, Flavoring   Substance and Sexual Activity   Alcohol use: Not Currently   Drug use: Yes    Types: Marijuana   Sexual activity: Yes    Birth control/protection: Pill, Condom  Other Topics Concern   Not on file  Social History Narrative   Not on file   Social Drivers of Health   Financial Resource Strain: Low Risk  (05/17/2023)   Overall Financial Resource Strain (CARDIA)    Difficulty of Paying Living Expenses: Not hard at all  Recent Concern: Financial Resource Strain - Medium Risk (02/20/2023)   Received from [facility]   Overall Financial Resource Strain (CARDIA)    Difficulty of Paying Living Expenses: Somewhat hard  Food Insecurity: No Food Insecurity (05/17/2023)   Hunger Vital Sign    Worried About Running Out of Food in the Last Year: Never true    Ran Out of Food in the Last Year: Never true  Transportation Needs: No Transportation Needs (05/17/2023)   PRAPARE - Administrator, Civil Service (Medical): No    Lack of Transportation (Non-Medical): No  Physical Activity: Sufficiently Active (05/17/2023)   Exercise Vital Sign    Days of Exercise per Week: 3 days    Minutes of Exercise per Session: 60 min  Stress: Stress Concern Present (05/17/2023)   [employer] of Occupational Health - Occupational Stress Questionnaire    Feeling of Stress : To some extent  Social Connections: Moderately Isolated (05/17/2023)   Social Connection and Isolation Panel [NHANES]    Frequency of Communication with Friends and Family: More than three times a week    Frequency of Social Gatherings with Friends and Family: More than three times a week    Attends Religious Services: 1 to 4 times per year    Active Member of [employer] or Organizations: No    Attends Banker Meetings: Never    Marital Status: Never married    Allergies:  Allergies  Allergen Reactions   Cat Dander Shortness Of Breath   Amoxicillin Rash    Metabolic Disorder Labs: No results found  for: "HGBA1C", "MPG" No results found for: "PROLACTIN" No results found for: "CHOL", "TRIG", "HDL", "CHOLHDL", "VLDL", "LDLCALC" No results found for: "TSH"  Therapeutic Level Labs: No results found for: "LITHIUM" No results found for: "VALPROATE" No results found for: "CBMZ"  Current Medications: Current Outpatient Medications  Medication Sig Dispense Refill   buPROPion (WELLBUTRIN) 75 MG tablet Take 1 tablet (75 mg total) by mouth in the morning. 30 tablet 1   albuterol (VENTOLIN HFA) 108 (90 Base) MCG/ACT inhaler Inhale into the lungs.     cetirizine (ZYRTEC) 10 MG tablet Take 10 mg by mouth daily.     fluticasone (FLONASE) 50 MCG/ACT nasal spray Place into the nose.     hydrOXYzine (ATARAX) 25 MG tablet Take 1 tablet (25 mg total) by mouth 2 (two) times daily as needed. For severe anxiety attacks , please limit use 60 tablet 1   norgestimate-ethinyl estradiol (MILI) 0.25-35 MG-MCG tablet Take 1 tablet by mouth  daily.     oseltamivir (TAMIFLU) 75 MG capsule TAKE 1 CAPSULE BY MOUTH TWICE A DAY FOR 5 DAYS     sodium fluoride (FLUORISHIELD) 1.1 % GEL dental gel BRUSH ONCE DAILY IN PLACE OF REGULAR TOOTHPASTE     venlafaxine XR (EFFEXOR-XR) 150 MG 24 hr capsule Take 1 capsule (150 mg total) by mouth daily with breakfast. 30 capsule 1   No current facility-administered medications for this visit.     Musculoskeletal: Strength & Muscle Tone:  UTA Gait & Station:  Seated Patient leans: N/A  Psychiatric Specialty Exam: Review of Systems  Psychiatric/Behavioral:  Positive for dysphoric mood.     There were no vitals taken for this visit.There is no height or weight on file to calculate BMI.  General Appearance: Casual  Eye Contact:  Fair  Speech:  Clear and Coherent  Volume:  Normal  Mood:  Depressed  Affect:  Appropriate  Thought Process:  Goal Directed and Descriptions of Associations: Intact  Orientation:  Full (Time, Place, and Person)  Thought Content: Logical   Suicidal  Thoughts:  No  Homicidal Thoughts:  No  Memory:  Immediate;   Fair Recent;   Fair Remote;   Fair  Judgement:  Fair  Insight:  Fair  Psychomotor Activity:  Normal  Concentration:  Concentration: Fair and Attention Span: Fair  Recall:  [employer] of Knowledge: Fair  Language: Fair  Akathisia:  No  Handed:  Right  AIMS (if indicated): not done  Assets:  Desire for Improvement Housing Social Support Transportation  ADL's:  Intact  Cognition: WNL  Sleep:  Fair   Screenings: GAD-7    Advertising copywriter from 06/23/2023 in [facility] Counselor from 05/17/2023 in [facility] Office Visit from 05/08/2023 in [facility]  Total GAD-7 Score 6 19 21       PHQ2-9    Flowsheet Row Office Visit from 06/26/2023 in [facility] Counselor from 06/23/2023 in [facility] Counselor from 05/17/2023 in [facility] Office Visit from 05/08/2023 in [facility]  PHQ-2 Total Score 3 2 6 6   PHQ-9 Total Score 16 6 26 22       Flowsheet Row Video Visit from 08/04/2023 in [facility] Office Visit from 06/26/2023 in [facility] Counselor from 05/17/2023 in [facility]  C-SSRS RISK CATEGORY No Risk No Risk Low Risk        Assessment and Plan: Lindsey Morrison is a 18 year old biracial female with history of depression, anxiety was evaluated by telemedicine today.  Discussed assessment and plan as noted below.  Major Depressive Disorder-unstable Increased depressive symptoms since starting venlafaxine, including persistent sadness  without active suicidal ideation. Depression has worsened despite improved  anxiety symptoms. Bupropion is considered to address depressive symptoms while maintaining venlafaxine for anxiety management. Bupropion may improve motivation, energy, and concentration without causing weight gain. Potential side effects include irritability, agitation, and possible worsening of anxiety. It should be taken in the morning to avoid sleep disturbances. Combining medications requires monitoring for drug interactions and emergent adverse effects such as confusion, hallucinations, tremors, or muscle spasms. - Initiate Bupropion 75 mg in the morning, ensuring it is taken at least a couple of hours apart from venlafaxine. - Monitor for emergent adverse effects such as confusion, hallucinations,  tremors, or muscle spasms, and advise to seek emergency care if these occur. - Educate about potential side effects of bupropion, including irritability, agitation, and potential worsening of anxiety. - Provide instructions about bupropion on the patient portal.  Generalized Anxiety Disorder-improving Improvement in anxiety symptoms with venlafaxine 150 mg, particularly in daily activities such as driving and social interactions. Anxiety persists in response to significant stressors like school tests. The current dose of venlafaxine is maintained to continue managing anxiety symptoms effectively.  - Continue Venlafaxine 150 mg daily. - Continue Hydroxyzine 25 mg twice a day as needed for anxiety. - Monitor anxiety levels, especially in response to bupropion initiation, as it may exacerbate anxiety in some patients. - Continue CBT with Ms. Janina Mayo  Follow-up Follow-up in clinic in 4 to 6 weeks or sooner if needed.   Collaboration of Care: Collaboration of Care: Referral or follow-up with counselor/therapist AEB encouraged to continue psychotherapy sessions.  Patient/Guardian was advised Release of Information must be obtained prior to any record release in order to collaborate their care  with an outside provider. Patient/Guardian was advised if they have not already done so to contact the registration department to sign all necessary forms in order for [place] to release information regarding their care.   Consent: Patient/Guardian gives verbal consent for treatment and assignment of benefits for services provided during this visit. Patient/Guardian expressed understanding and agreed to proceed.  This note was generated in part or whole with voice recognition software. Voice recognition is usually quite accurate but there are transcription errors that can and very often do occur. I apologize for any typographical errors that were not detected and corrected.   Discussed the use of a AI scribe software for clinical note transcription with the patient, who gave verbal consent to proceed.    Jomarie Longs, MD 08/04/2023, 2:07 PM

## 2023-08-07 ENCOUNTER — Ambulatory Visit (INDEPENDENT_AMBULATORY_CARE_PROVIDER_SITE_OTHER): Payer: Medicaid Other | Admitting: Professional Counselor

## 2023-08-07 DIAGNOSIS — F411 Generalized anxiety disorder: Secondary | ICD-10-CM

## 2023-08-07 NOTE — Progress Notes (Signed)
  THERAPIST PROGRESS NOTE  Session Time: 4:00 PM - 4:50 PM   Participation Level: Active  Behavioral Response: Casual, Alert, Irritable  Type of Therapy: Individual Therapy  Treatment Goals addressed: Active Anxiety  LTG: "I want to figure out what's wrong with me and why I think the way I think. I'm very impulsive. I want to find solutions to my problems without spiraling over things. Just figuring out my brain and why I feel the way I feel."                 Start:  05/24/23    Expected End:  05/22/24      STG: Report a decrease in anxiety and depression symptoms as evidenced by an overall reduction in score by a minimum of 25% on the GAD-7 and PHQ-9    STG: "Just trying to get a grasp of my emotions and why I'm so moody and all over the place." Improve emotional intelligence AEB ability to identify emotions and implement emotion regulation skills and cognitive restructuring over the next 12 weeks.     Demonstrates progress in stages of grief at own pace   ProgressTowards Goals: Progressing  Interventions: CBT, Motivational Interviewing, and Supportive  Summary: Lindsey Morrison is a 18 y.o. female who presents with a history of anxiety and depression. She appeared alert and oriented x5. She stated "there's a lot to unpack." She expressed frustration with her nana and school. Lindsey Morrison has three weeks left before she can graduate. She struggles to find things she can control in either situation. She expressed wanting to reach out to her ex-boyfriend to tell him happy birthday this week. She explored reasons why and discussed pros/cons of making contact. Lindsey Morrison discussed future plans she has in life.  Therapist Response: Conducted session with Lindsey Morrison. Began session with check-in/update since previous session. Utilized empathetic and reflective listening. Used open-ended questions to facilitate discussion and summarized Lindsey Morrison's thoughts/feelings. Explored what's in Lindsey Morrison's control. Processed her  reasons for wanting to make contact with her ex-boyfriend and weighed pros/cons. Engaged in discussion about future plans. Scheduled additional appointment and concluded session.   Suicidal/Homicidal: No  Plan: Return again in 3 weeks.  Diagnosis: GAD (generalized anxiety disorder)  Collaboration of Care: Medication Management AEB chart review  Patient/Guardian was advised Release of Information must be obtained prior to any record release in order to collaborate their care with an outside provider. Patient/Guardian was advised if they have not already done so to contact the registration department to sign all necessary forms in order for [place] to release information regarding their care.   Consent: Patient/Guardian gives verbal consent for treatment and assignment of benefits for services provided during this visit. Patient/Guardian expressed understanding and agreed to proceed.   Edmonia Lynch, [facility] 08/07/2023

## 2023-08-18 DIAGNOSIS — M222X2 Patellofemoral disorders, left knee: Secondary | ICD-10-CM | POA: Diagnosis not present

## 2023-08-18 DIAGNOSIS — M25562 Pain in left knee: Secondary | ICD-10-CM | POA: Diagnosis not present

## 2023-08-23 DIAGNOSIS — M25512 Pain in left shoulder: Secondary | ICD-10-CM | POA: Insufficient documentation

## 2023-08-24 ENCOUNTER — Ambulatory Visit (INDEPENDENT_AMBULATORY_CARE_PROVIDER_SITE_OTHER): Admitting: Professional Counselor

## 2023-08-24 ENCOUNTER — Encounter: Payer: Self-pay | Admitting: Professional Counselor

## 2023-08-24 DIAGNOSIS — F411 Generalized anxiety disorder: Secondary | ICD-10-CM | POA: Diagnosis not present

## 2023-08-24 NOTE — Progress Notes (Signed)
  THERAPIST PROGRESS NOTE  Session Time: 8:10 AM - 8:50 AM  Participation Level: Active  Behavioral Response: Casual, Alert, Euthymic  Type of Therapy: Individual Therapy  Treatment Goals addressed:  Active Anxiety  LTG: "I want to figure out what's wrong with me and why I think the way I think. I'm very impulsive. I want to find solutions to my problems without spiraling over things. Just figuring out my brain and why I feel the way I feel."                 Start:  05/24/23    Expected End:  05/22/24      STG: Report a decrease in anxiety and depression symptoms as evidenced by an overall reduction in score by a minimum of 25% on the GAD-7 and PHQ-9    STG: "Just trying to get a grasp of my emotions and why I'm so moody and all over the place." Improve emotional intelligence AEB ability to identify emotions and implement emotion regulation skills and cognitive restructuring over the next 12 weeks.     Demonstrates progress in stages of grief at own pace   ProgressTowards Goals: Progressing  Interventions: Motivational Interviewing and Supportive  Summary: Talasia J Byer is a 18 y.o. female who presents with a history of anxiety and depression. She appeared alert and oriented x5. She was wearing a sling on her left arm and reported she has "torn something." She is working with an orthopedic but has to wait until May to get a MRI. 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 She expressed concern that this injury might impact her ability to graduate and take the state board. [name] noted things are going well at home although she still has issues with the lack of cleanliness from her grandmother. She discussed future plans she hopes to pursue.  Therapist Response: Conducted session with [name]. Began session with check-in/update since previous session. Utilized empathetic and reflective listening. Used open-ended questions to facilitate discussion and summarized [name]'s  thoughts/feelings. Scheduled additional appointment and concluded session.   Suicidal/Homicidal: No  Plan: Return again in 4 weeks.  Diagnosis: GAD (generalized anxiety disorder)  Collaboration of Care: Medication Management AEB chart review  Patient/Guardian was advised Release of Information must be obtained prior to any record release in order to collaborate their care with an outside provider. Patient/Guardian was advised if they have not already done so to contact the registration department to sign all necessary forms in order for us  to release information regarding their care.   Consent: Patient/Guardian gives verbal consent for treatment and assignment of benefits for services provided during this visit. Patient/Guardian expressed understanding and agreed to proceed.   Len Quale, [facility] 08/24/2023

## 2023-08-28 DIAGNOSIS — F321 Major depressive disorder, single episode, moderate: Secondary | ICD-10-CM

## 2023-09-01 DIAGNOSIS — M25562 Pain in left knee: Secondary | ICD-10-CM | POA: Diagnosis not present

## 2023-09-01 DIAGNOSIS — M222X2 Patellofemoral disorders, left knee: Secondary | ICD-10-CM | POA: Diagnosis not present

## 2023-09-05 DIAGNOSIS — M25512 Pain in left shoulder: Secondary | ICD-10-CM | POA: Diagnosis not present

## 2023-09-08 DIAGNOSIS — M222X2 Patellofemoral disorders, left knee: Secondary | ICD-10-CM | POA: Diagnosis not present

## 2023-09-08 DIAGNOSIS — M25562 Pain in left knee: Secondary | ICD-10-CM | POA: Diagnosis not present

## 2023-09-14 ENCOUNTER — Telehealth (INDEPENDENT_AMBULATORY_CARE_PROVIDER_SITE_OTHER): Admitting: Psychiatry

## 2023-09-14 DIAGNOSIS — F321 Major depressive disorder, single episode, moderate: Secondary | ICD-10-CM

## 2023-09-14 NOTE — Progress Notes (Signed)
 Patient currently out of [place], unable to have telemedicine visit.  Patient advised to call back to reschedule.

## 2023-09-20 ENCOUNTER — Ambulatory Visit (INDEPENDENT_AMBULATORY_CARE_PROVIDER_SITE_OTHER): Admitting: Professional Counselor

## 2023-09-20 DIAGNOSIS — M25512 Pain in left shoulder: Secondary | ICD-10-CM | POA: Diagnosis not present

## 2023-09-20 DIAGNOSIS — F411 Generalized anxiety disorder: Secondary | ICD-10-CM

## 2023-09-20 DIAGNOSIS — M7522 Bicipital tendinitis, left shoulder: Secondary | ICD-10-CM | POA: Diagnosis not present

## 2023-09-20 NOTE — Progress Notes (Signed)
  THERAPIST PROGRESS NOTE  Session Time: 3:00 PM - 3:53 PM   Participation Level: Active  Behavioral Response: Casual, Alert, Anxious  Type of Therapy: Individual Therapy  Treatment Goals addressed: Active Anxiety  LTG: "I want to figure out what's wrong with me and why I think the way I think. I'm very impulsive. I want to find solutions to my problems without spiraling over things. Just figuring out my brain and why I feel the way I feel."                 Start:  05/24/23    Expected End:  05/22/24      STG: Report a decrease in anxiety and depression symptoms as evidenced by an overall reduction in score by a minimum of 25% on the GAD-7 and PHQ-9    STG: "Just trying to get a grasp of my emotions and why I'm so moody and all over the place." Improve emotional intelligence AEB ability to identify emotions and implement emotion regulation skills and cognitive restructuring over the next 12 weeks.     Demonstrates progress in stages of grief at own pace   ProgressTowards Goals: Progressing  Interventions: CBT, Motivational Interviewing, and Supportive  Summary: Lindsey Morrison is a 18 y.o. female who presents with a history of anxiety and depression. She appeared alert and oriented x5. 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 Lindsey Morrison is still waiting for the state boards though which are scheduled for June. 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 She has various plans about money she will earn. Lindsey Morrison was happy to report she was able to drive on the highway twice. She discussed ongoing anxieties and reported she will try to continue with exposure exercises. She discussed future goals of finding hobbies and a long-term career. She was receptive to input from this Clinical research associate on school and career resources.   Therapist Response: Conducted session with Lindsey Morrison. Began session with check-in/update since previous session. Utilized empathetic and reflective listening.  Congratulated Lindsey Morrison on her graduation and praised her for completing exposure exercises of driving on the highway. Processed additional concerns and highlighted the difference between things that are possible and things that are likely. Explored future hobbies and goals and offered resources for education/career resources. Scheduled additional appointment and concluded session.   Suicidal/Homicidal: No  Plan: Return again in 3 weeks.  Diagnosis: GAD (generalized anxiety disorder)  Collaboration of Care: Medication Management AEB chart review  Patient/Guardian was advised Release of Information must be obtained prior to any record release in order to collaborate their care with an outside provider. Patient/Guardian was advised if they have not already done so to contact the registration department to sign all necessary forms in order for us  to release information regarding their care.   Consent: Patient/Guardian gives verbal consent for treatment and assignment of benefits for services provided during this visit. Patient/Guardian expressed understanding and agreed to proceed.   Len Quale, [facility] 09/20/2023

## 2023-09-21 DIAGNOSIS — M25562 Pain in left knee: Secondary | ICD-10-CM | POA: Diagnosis not present

## 2023-09-21 DIAGNOSIS — M222X2 Patellofemoral disorders, left knee: Secondary | ICD-10-CM | POA: Diagnosis not present

## 2023-10-04 DIAGNOSIS — M25562 Pain in left knee: Secondary | ICD-10-CM | POA: Diagnosis not present

## 2023-10-04 DIAGNOSIS — M25512 Pain in left shoulder: Secondary | ICD-10-CM | POA: Diagnosis not present

## 2023-10-04 DIAGNOSIS — M222X2 Patellofemoral disorders, left knee: Secondary | ICD-10-CM | POA: Diagnosis not present

## 2023-10-11 DIAGNOSIS — M222X2 Patellofemoral disorders, left knee: Secondary | ICD-10-CM | POA: Diagnosis not present

## 2023-10-11 DIAGNOSIS — M25562 Pain in left knee: Secondary | ICD-10-CM | POA: Diagnosis not present

## 2023-10-12 ENCOUNTER — Ambulatory Visit (INDEPENDENT_AMBULATORY_CARE_PROVIDER_SITE_OTHER): Admitting: Professional Counselor

## 2023-10-12 DIAGNOSIS — F411 Generalized anxiety disorder: Secondary | ICD-10-CM

## 2023-10-12 NOTE — Progress Notes (Signed)
  THERAPIST PROGRESS NOTE  Session Time: 4:05 PM - 4:53 PM   Participation Level: Active  Behavioral Response: Casual, Alert, Anxious  Type of Therapy: Individual Therapy  Treatment Goals addressed: Active Anxiety  LTG: "I want to figure out what's wrong with me and why I think the way I think. I'm very impulsive. I want to find solutions to my problems without spiraling over things. Just figuring out my brain and why I feel the way I feel."                 Start:  05/24/23    Expected End:  05/22/24      STG: Report a decrease in anxiety and depression symptoms as evidenced by an overall reduction in score by a minimum of 25% on the GAD-7 and PHQ-9    STG: "Just trying to get a grasp of my emotions and why I'm so moody and all over the place." Improve emotional intelligence AEB ability to identify emotions and implement emotion regulation skills and cognitive restructuring over the next 12 weeks.     Demonstrates progress in stages of grief at own pace   ProgressTowards Goals: Progressing  Interventions: CBT, Motivational Interviewing, and Supportive  Summary: Lindsey Morrison is a 18 y.o. female who presents with a history of anxiety and depression. She appeared alert and oriented x5. She stated she passed her state board test but still has to take the practical portion. This is scheduled for June 16th. Lindsey Morrison decided not to work at [employer]. 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 She noted some depression and procrastination with cleaning her room. She was receptive to chunking to help get things done. She reported it will be helpful since her medications are somewhere in her room. Lindsey Morrison was able to drive on the highway again. She discussed other things that make her afraid. She agreed it is always thinking the worst but things are possible.    Therapist Response: Conducted session with Lindsey Morrison. Began session with check-in/update since previous session. Utilized  empathetic and reflective listening. Used open-ended questions to facilitate discussion and summarized Lindsey Morrison's thoughts/feelings. Discussed chunking to help with productivity. Highlighted Lindsey Morrison's tendency to catastrophize. Praised Lindsey Morrison for continuing to engage in exposure to driving on the highway. Scheduled additional appointment and concluded session.   Suicidal/Homicidal: No  Plan: Return again in 4 weeks.  Diagnosis: GAD (generalized anxiety disorder)  Collaboration of Care: Medication Management AEB chart review  Patient/Guardian was advised Release of Information must be obtained prior to any record release in order to collaborate their care with an outside provider. Patient/Guardian was advised if they have not already done so to contact the registration department to sign all necessary forms in order for us  to release information regarding their care.   Consent: Patient/Guardian gives verbal consent for treatment and assignment of benefits for services provided during this visit. Patient/Guardian expressed understanding and agreed to proceed.   Len Quale, [facility] 10/12/2023

## 2023-10-15 NOTE — Progress Notes (Unsigned)
 Virtual Visit via Video Note  I connected with Lindsey Morrison on 10/16/23 at  4:20 PM EDT by a video enabled telemedicine application and verified that I am speaking with the correct person using two identifiers.  Location Provider Location : [facility] Patient Location : Home  Participants: Patient , Provider    I discussed the limitations of evaluation and management by telemedicine and the availability of in person appointments. The patient expressed understanding and agreed to proceed.   I discussed the assessment and treatment plan with the patient. The patient was provided an opportunity to ask questions and all were answered. The patient agreed with the plan and demonstrated an understanding of the instructions.   The patient was advised to call back or seek an in-person evaluation if the symptoms worsen or if the condition fails to improve as anticipated.  BH MD OP Progress Note  10/16/2023 4:40 PM Lindsey Morrison  MRN:  [id number]  Chief Complaint:  Chief Complaint  Patient presents with   Follow-up   Depression   Anxiety   Medication Refill   Discussed the use of AI scribe software for clinical note transcription with the patient, who gave verbal consent to proceed.  History of Present Illness Lindsey Morrison is a 18 year old biracial female, single, has a history of GAD, depression, asthma, menstrual irregularities was evaluated by telemedicine today.  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 She is uncertain if her improved mood is due to these accomplishments or her medication regimen. She continues to engage in therapy sessions with her therapist, Ms. Josiephine Nightingale, approximately once a month, which she finds beneficial in managing anxiety and panic attacks. She has developed better self-calming techniques.  Her current medication  regimen includes Wellbutrin  75 mg, which was initiated in March. She has only taken hydroxyzine  once since March, following a shoulder injury that triggered a panic attack. She has discontinued Effexor  and birth control in the past few months.  She had stopped taking all her medications including the Effexor  and hence did not restart it.  Currently doing well on the Wellbutrin .  There have been no changes in her sleep patterns, as she sleeps through the night. Her appetite remains stable, although she has been consuming less sugary foods and attending the gym in an effort to lose weight, though she has not yet observed any weight loss.  Denies thoughts of self-harm or harm to others are present.    Visit Diagnosis:    ICD-10-CM   1. GAD (generalized anxiety disorder)  F41.1     2. Major depressive disorder with single episode, in full remission (HCC)  F32.5 buPROPion  (WELLBUTRIN ) 75 MG tablet      Past Psychiatric History: I have reviewed past psychiatric history from progress note on 05/08/2023.  Past trials of medications like Zoloft-made her worse, Prozac, Lexapro, trazodone, Effexor -noncompliant  Past Medical History:  Past Medical History:  Diagnosis Date   Ankle fracture, right    Anxiety    Asthma    Depression    History reviewed. No pertinent surgical history.  Family Psychiatric History: I have reviewed family psychiatric history from progress note on 05/08/2023.  Family History:  Family History  Problem Relation Age of Onset   Depression Mother    Anxiety disorder Mother    Drug abuse Mother    Drug abuse Father  Drug abuse Maternal Uncle    Suicidality Maternal Uncle    Schizophrenia Other     Social History: I have reviewed social history from progress note on 05/08/2023. Social History   Socioeconomic History   Marital status: Single    Spouse name: Not on file   Number of children: Not on file   Years of education: Not on file   Highest education level:  High school graduate  Occupational History   Not on file  Tobacco Use   Smoking status: Never   Smokeless tobacco: Never  Vaping Use   Vaping status: Every Day   Substances: Nicotine, Flavoring  Substance and Sexual Activity   Alcohol use: Not Currently   Drug use: Yes    Types: Marijuana   Sexual activity: Yes    Birth control/protection: Pill, Condom  Other Topics Concern   Not on file  Social History Narrative   Not on file   Social Drivers of Health   Financial Resource Strain: Low Risk  (05/17/2023)   Overall Financial Resource Strain (CARDIA)    Difficulty of Paying Living Expenses: Not hard at all  Recent Concern: Financial Resource Strain - Medium Risk (02/20/2023)   Received from [facility]   Overall Financial Resource Strain (CARDIA)    Difficulty of Paying Living Expenses: Somewhat hard  Food Insecurity: No Food Insecurity (05/17/2023)   Hunger Vital Sign    Worried About Running Out of Food in the Last Year: Never true    Ran Out of Food in the Last Year: Never true  Transportation Needs: No Transportation Needs (05/17/2023)   PRAPARE - Administrator, Civil Service (Medical): No    Lack of Transportation (Non-Medical): No  Physical Activity: Sufficiently Active (05/17/2023)   Exercise Vital Sign    Days of Exercise per Week: 3 days    Minutes of Exercise per Session: 60 min  Stress: Stress Concern Present (05/17/2023)   [employer] of Occupational Health - Occupational Stress Questionnaire    Feeling of Stress : To some extent  Social Connections: Moderately Isolated (05/17/2023)   Social Connection and Isolation Panel [NHANES]    Frequency of Communication with Friends and Family: More than three times a week    Frequency of Social Gatherings with Friends and Family: More than three times a week    Attends Religious Services: 1 to 4 times per year    Active Member of [employer] or Organizations: No    Attends Banker  Meetings: Never    Marital Status: Never married    Allergies:  Allergies  Allergen Reactions   Cat Dander Shortness Of Breath   Amoxicillin Rash    Metabolic Disorder Labs: No results found for: "HGBA1C", "MPG" No results found for: "PROLACTIN" No results found for: "CHOL", "TRIG", "HDL", "CHOLHDL", "VLDL", "LDLCALC" No results found for: "TSH"  Therapeutic Level Labs: No results found for: "LITHIUM" No results found for: "VALPROATE" No results found for: "CBMZ"  Current Medications: Current Outpatient Medications  Medication Sig Dispense Refill   albuterol (VENTOLIN HFA) 108 (90 Base) MCG/ACT inhaler Inhale into the lungs.     cetirizine (ZYRTEC) 10 MG tablet Take 10 mg by mouth daily.     fluticasone (FLONASE) 50 MCG/ACT nasal spray Place into the nose.     hydrOXYzine  (ATARAX ) 25 MG tablet Take 1 tablet (25 mg total) by mouth 2 (two) times daily as needed. For severe anxiety attacks , please limit use 60 tablet  1   sodium fluoride  (FLUORISHIELD) 1.1 % GEL dental gel BRUSH ONCE DAILY IN PLACE OF REGULAR TOOTHPASTE     buPROPion  (WELLBUTRIN ) 75 MG tablet Take 1 tablet (75 mg total) by mouth in the morning. 30 tablet 2   No current facility-administered medications for this visit.     Musculoskeletal: Strength & Muscle Tone: UTA Gait & Station: Seated Patient leans: N/A  Psychiatric Specialty Exam: Review of Systems  Psychiatric/Behavioral: Negative.      There were no vitals taken for this visit.There is no height or weight on file to calculate BMI.  General Appearance: Casual  Eye Contact:  Fair  Speech:  Normal Rate  Volume:  Normal  Mood:  Euthymic  Affect:  Congruent  Thought Process:  Goal Directed and Descriptions of Associations: Intact  Orientation:  Full (Time, Place, and Person)  Thought Content: Logical   Suicidal Thoughts:  No  Homicidal Thoughts:  No  Memory:  Immediate;   Fair Recent;   Fair Remote;   Fair  Judgement:  Fair  Insight:  Fair   Psychomotor Activity:  Normal  Concentration:  Concentration: Fair and Attention Span: Fair  Recall:  [employer] of Knowledge: Fair  Language: Fair  Akathisia:  No  Handed:  Right  AIMS (if indicated): not done  Assets:  Communication Skills Desire for Improvement Housing Social Support Transportation  ADL's:  Intact  Cognition: WNL  Sleep:  Fair   Screenings: GAD-7    Advertising copywriter from 06/23/2023 in [facility] Counselor from 05/17/2023 in [facility] Office Visit from 05/08/2023 in [facility]  Total GAD-7 Score 6 19 21       PHQ2-9    Flowsheet Row Office Visit from 06/26/2023 in [facility] Counselor from 06/23/2023 in [facility] Counselor from 05/17/2023 in [facility] Office Visit from 05/08/2023 in [facility]  PHQ-2 Total Score 3 2 6 6   PHQ-9 Total Score 16 6 26 22       Flowsheet Row Video Visit from 10/16/2023 in [facility] Video Visit from 08/04/2023 in [facility] Office Visit from 06/26/2023 in [facility]  C-SSRS RISK CATEGORY No Risk No Risk No Risk        Assessment and Plan: Lindsey Morrison is a 18 year old biracial female with history of depression, anxiety was evaluated by telemedicine today.  Discussed assessment and plan as noted below.  Major depressive disorder in remission Currently reports depression symptoms as improved on the current medication regimen. Continue Bupropion  75 mg daily Continue CBT with Ms. Deetta Farrow  Generalized anxiety disorder-improving Currently reports anxiety symptoms as improving, working on coping strategies with  therapist. Continue Hydroxyzine  25 mg twice a day as needed for anxiety Continue CBT  Follow-up Follow-up in clinic in 3 months or sooner if needed.   Collaboration of Care: Collaboration of Care: Referral or follow-up with counselor/therapist AEB I have reviewed notes per Ms. Deetta Farrow dated 10/12/2023-currently engaged in psychotherapy/motivational interviewing.  Patient/Guardian was advised Release of Information must be obtained prior to any record release in order to collaborate their care with an outside provider. Patient/Guardian was advised if they have not already done so to contact the registration department to sign all necessary forms in order for us  to release information regarding their  care.   Consent: Patient/Guardian gives verbal consent for treatment and assignment of benefits for services provided during this visit. Patient/Guardian expressed understanding and agreed to proceed.   This note was generated in part or whole with voice recognition software. Voice recognition is usually quite accurate but there are transcription errors that can and very often do occur. I apologize for any typographical errors that were not detected and corrected.    Yanin Muhlestein, MD 10/17/2023, 8:29 AM

## 2023-10-16 ENCOUNTER — Encounter: Payer: Self-pay | Admitting: Psychiatry

## 2023-10-16 ENCOUNTER — Telehealth (INDEPENDENT_AMBULATORY_CARE_PROVIDER_SITE_OTHER): Admitting: Psychiatry

## 2023-10-16 DIAGNOSIS — F325 Major depressive disorder, single episode, in full remission: Secondary | ICD-10-CM | POA: Diagnosis not present

## 2023-10-16 DIAGNOSIS — F411 Generalized anxiety disorder: Secondary | ICD-10-CM | POA: Diagnosis not present

## 2023-10-16 MED ORDER — BUPROPION HCL 75 MG PO TABS: 75.0000 mg | ORAL_TABLET | Freq: Every morning | ORAL | 2 refills | Status: DC

## 2023-10-25 DIAGNOSIS — M222X2 Patellofemoral disorders, left knee: Secondary | ICD-10-CM | POA: Diagnosis not present

## 2023-10-25 DIAGNOSIS — M7542 Impingement syndrome of left shoulder: Secondary | ICD-10-CM | POA: Diagnosis not present

## 2023-10-25 DIAGNOSIS — M25562 Pain in left knee: Secondary | ICD-10-CM | POA: Diagnosis not present

## 2023-10-25 DIAGNOSIS — M25512 Pain in left shoulder: Secondary | ICD-10-CM | POA: Diagnosis not present

## 2023-11-06 DIAGNOSIS — M25512 Pain in left shoulder: Secondary | ICD-10-CM | POA: Diagnosis not present

## 2023-11-06 DIAGNOSIS — M222X2 Patellofemoral disorders, left knee: Secondary | ICD-10-CM | POA: Diagnosis not present

## 2023-11-06 DIAGNOSIS — M7542 Impingement syndrome of left shoulder: Secondary | ICD-10-CM | POA: Diagnosis not present

## 2023-11-06 DIAGNOSIS — M25562 Pain in left knee: Secondary | ICD-10-CM | POA: Diagnosis not present

## 2023-11-08 ENCOUNTER — Ambulatory Visit (INDEPENDENT_AMBULATORY_CARE_PROVIDER_SITE_OTHER): Admitting: Professional Counselor

## 2023-11-08 DIAGNOSIS — F411 Generalized anxiety disorder: Secondary | ICD-10-CM

## 2023-11-08 NOTE — Progress Notes (Signed)
  THERAPIST PROGRESS NOTE  Session Time: 2:03 PM - 2:53 PM   Participation Level: Active  Behavioral Response: Casual, Alert, Anxious  Type of Therapy: Individual Therapy  Treatment Goals addressed:  Active Anxiety  LTG: I want to figure out what's wrong with me and why I think the way I think. I'm very impulsive. I want to find solutions to my problems without spiraling over things. Just figuring out my brain and why I feel the way I feel.                 Start:  05/24/23    Expected End:  05/22/24      STG: Report a decrease in anxiety and depression symptoms as evidenced by an overall reduction in score by a minimum of 25% on the GAD-7 and PHQ-9    STG: Just trying to get a grasp of my emotions and why I'm so moody and all over the place. Improve emotional intelligence AEB ability to identify emotions and implement emotion regulation skills and cognitive restructuring over the next 12 weeks.     Demonstrates progress in stages of grief at own pace   ProgressTowards Goals: Progressing  Interventions: CBT, Motivational Interviewing, and Supportive  Summary: [name] is a 18 y.o. female who presents with a history of anxiety and depression. She appeared alert and oriented x5. 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 She still isn't working yet, but is planning to job search next week. Lindsey Morrison has continued to expose herself to driving situations. She is improving but noted a new fear of going over bridges. She engaged in discussion about other future plans, including trips and educational goals.   Therapist Response: Conducted session with Lindsey Morrison. Began session with check-in/update since previous session. Utilized empathetic and reflective listening. Used open-ended questions to facilitate discussion and summarized Lindsey Morrison's thoughts/feelings. Explored thoughts behind fear of bridges. Discussed future plan and goals, weighing various options, pros and cons. Scheduled  additional appointment and concluded session.   Suicidal/Homicidal: No  Plan: Return again in 2 weeks.  Diagnosis: GAD (generalized anxiety disorder)  Collaboration of Care: Medication Management AEB chart review  Patient/Guardian was advised Release of Information must be obtained prior to any record release in order to collaborate their care with an outside provider. Patient/Guardian was advised if they have not already done so to contact the registration department to sign all necessary forms in order for us  to release information regarding their care.   Consent: Patient/Guardian gives verbal consent for treatment and assignment of benefits for services provided during this visit. Patient/Guardian expressed understanding and agreed to proceed.   [name], [facility] 11/08/2023

## 2023-11-15 DIAGNOSIS — M25512 Pain in left shoulder: Secondary | ICD-10-CM | POA: Diagnosis not present

## 2023-11-15 DIAGNOSIS — M7542 Impingement syndrome of left shoulder: Secondary | ICD-10-CM | POA: Diagnosis not present

## 2023-11-15 DIAGNOSIS — M222X2 Patellofemoral disorders, left knee: Secondary | ICD-10-CM | POA: Diagnosis not present

## 2023-11-15 DIAGNOSIS — M25562 Pain in left knee: Secondary | ICD-10-CM | POA: Diagnosis not present

## 2023-11-22 DIAGNOSIS — M7542 Impingement syndrome of left shoulder: Secondary | ICD-10-CM | POA: Diagnosis not present

## 2023-11-22 DIAGNOSIS — M25512 Pain in left shoulder: Secondary | ICD-10-CM | POA: Diagnosis not present

## 2023-11-22 DIAGNOSIS — M222X2 Patellofemoral disorders, left knee: Secondary | ICD-10-CM | POA: Diagnosis not present

## 2023-11-22 DIAGNOSIS — M25562 Pain in left knee: Secondary | ICD-10-CM | POA: Diagnosis not present

## 2023-11-23 ENCOUNTER — Ambulatory Visit: Admitting: Professional Counselor

## 2023-11-28 DIAGNOSIS — M25512 Pain in left shoulder: Secondary | ICD-10-CM | POA: Diagnosis not present

## 2023-11-28 DIAGNOSIS — M25562 Pain in left knee: Secondary | ICD-10-CM | POA: Diagnosis not present

## 2023-11-28 DIAGNOSIS — M222X2 Patellofemoral disorders, left knee: Secondary | ICD-10-CM | POA: Diagnosis not present

## 2023-11-28 DIAGNOSIS — M7542 Impingement syndrome of left shoulder: Secondary | ICD-10-CM | POA: Diagnosis not present

## 2023-11-30 ENCOUNTER — Ambulatory Visit (INDEPENDENT_AMBULATORY_CARE_PROVIDER_SITE_OTHER): Admitting: Professional Counselor

## 2023-11-30 DIAGNOSIS — F411 Generalized anxiety disorder: Secondary | ICD-10-CM | POA: Diagnosis not present

## 2023-11-30 NOTE — Progress Notes (Signed)
  THERAPIST PROGRESS NOTE  Session Time: 10:01 AM - 10:54 AM  Participation Level: Active  Behavioral Response: Casual, Alert, Anxious  Type of Therapy: Individual Therapy  Treatment Goals addressed:  Active Anxiety  LTG: I want to figure out what's wrong with me and why I think the way I think. I'm very impulsive. I want to find solutions to my problems without spiraling over things. Just figuring out my brain and why I feel the way I feel.                 Start:  05/24/23    Expected End:  05/22/24      STG: Report a decrease in anxiety and depression symptoms as evidenced by an overall reduction in score by a minimum of 25% on the GAD-7 and PHQ-9    STG: Just trying to get a grasp of my emotions and why I'm so moody and all over the place. Improve emotional intelligence AEB ability to identify emotions and implement emotion regulation skills and cognitive restructuring over the next 12 weeks.     Demonstrates progress in stages of grief at own pace   ProgressTowards Goals: Progressing  Interventions: CBT, Motivational Interviewing, and Supportive  Summary: Silveria J Ke is a 18 y.o. female who presents with a history of anxiety and depression. She appeared alert and oriented x5. She reported she has signed up for classes at the community college. [name] explored thoughts on future education/career plans. She continues to struggle with anxious thoughts, citing what if scenarios and worst case scenarios. She was receptive to Socratic questioning and re-framing negative thinking.   Therapist Response: Conducted session with [name]. Began session with check-in/update since previous session. Utilized empathetic and reflective listening. Used open-ended questions to facilitate discussion and summarized thoughts/feelings. Used Socratic questioning to challenge negative thinking and encouraged [name] to re-frame those thoughts. Scheduled additional appointment and concluded session.    Suicidal/Homicidal: No  Plan: Return again in 2 weeks.  Diagnosis: GAD (generalized anxiety disorder)  Collaboration of Care: Medication Management AEB chart review  Patient/Guardian was advised Release of Information must be obtained prior to any record release in order to collaborate their care with an outside provider. Patient/Guardian was advised if they have not already done so to contact the registration department to sign all necessary forms in order for us  to release information regarding their care.   Consent: Patient/Guardian gives verbal consent for treatment and assignment of benefits for services provided during this visit. Patient/Guardian expressed understanding and agreed to proceed.   [name], [facility] 11/30/2023

## 2023-12-13 ENCOUNTER — Ambulatory Visit (INDEPENDENT_AMBULATORY_CARE_PROVIDER_SITE_OTHER): Admitting: Professional Counselor

## 2023-12-13 DIAGNOSIS — F325 Major depressive disorder, single episode, in full remission: Secondary | ICD-10-CM | POA: Diagnosis not present

## 2023-12-13 DIAGNOSIS — F411 Generalized anxiety disorder: Secondary | ICD-10-CM | POA: Diagnosis not present

## 2023-12-13 NOTE — Progress Notes (Signed)
  THERAPIST PROGRESS NOTE  Session Time: 8:02 AM - 8:47 AM   Participation Level: Active  Behavioral Response: Casual, Alert, Anxious and Dysphoric  Type of Therapy: Individual Therapy  Treatment Goals addressed: Active Anxiety  LTG: I want to figure out what's wrong with me and why I think the way I think. I'm very impulsive. I want to find solutions to my problems without spiraling over things. Just figuring out my brain and why I feel the way I feel.                 Start:  05/24/23    Expected End:  05/22/24      STG: Report a decrease in anxiety and depression symptoms as evidenced by an overall reduction in score by a minimum of 25% on the GAD-7 and PHQ-9    STG: Just trying to get a grasp of my emotions and why I'm so moody and all over the place. Improve emotional intelligence AEB ability to identify emotions and implement emotion regulation skills and cognitive restructuring over the next 12 weeks.     Demonstrates progress in stages of grief at own pace  ProgressTowards Goals: Progressing  Interventions: CBT, Motivational Interviewing, Solution Focused, and Supportive  Summary: Lindsey Morrison is a 18 y.o. female who presents with a history of anxiety and depression. She appeared flustered but oriented x5. She stated she is not doing well. [name] noted various things in her life that are in order but she feels as though her mind and mood are not. She actively listened to coping skills for depression. [name] engaged in discussion about potential education/career goals. She completed career assessment on [email or web address]. She engaged in guided meditation for positive energy and stated it was helpful. She will try to practice more of these on her own.   Therapist Response: Conducted session with [name]. Began session with check-in/update since previous session. Utilized empathetic and reflective listening. Used open-ended questions to facilitate discussion and summarized  thoughts/feelings. Explored coping skills for depression (behavior activation, social support, gratitude, and mindfulness). Explored education/career options and provided resource with [email or web address]. Had [name] complete career assessment. Engaged [name] in guided meditation for positive energy. Scheduled additional appointment and concluded session.   Suicidal/Homicidal: No  Plan: Return again in 4 weeks.  Diagnosis: GAD (generalized anxiety disorder)  Major depressive disorder with single episode, in full remission (HCC)  Collaboration of Care: Medication Management AEB chart review  Patient/Guardian was advised Release of Information must be obtained prior to any record release in order to collaborate their care with an outside provider. Patient/Guardian was advised if they have not already done so to contact the registration department to sign all necessary forms in order for us  to release information regarding their care.   Consent: Patient/Guardian gives verbal consent for treatment and assignment of benefits for services provided during this visit. Patient/Guardian expressed understanding and agreed to proceed.   [name], [facility] 12/13/2023

## 2024-01-09 ENCOUNTER — Ambulatory Visit (INDEPENDENT_AMBULATORY_CARE_PROVIDER_SITE_OTHER): Admitting: Professional Counselor

## 2024-01-09 ENCOUNTER — Other Ambulatory Visit: Payer: Self-pay | Admitting: Psychiatry

## 2024-01-09 DIAGNOSIS — F411 Generalized anxiety disorder: Secondary | ICD-10-CM

## 2024-01-09 DIAGNOSIS — F325 Major depressive disorder, single episode, in full remission: Secondary | ICD-10-CM

## 2024-01-09 NOTE — Progress Notes (Unsigned)
 THERAPIST PROGRESS NOTE  Virtual Visit via Video Note  I connected with Lindsey Morrison on 01/10/24 at  2:00 PM EDT by a video enabled telemedicine application and verified that I am speaking with the correct person using two identifiers.  Location: Patient: Home Provider: Office   I discussed the limitations of evaluation and management by telemedicine and the availability of in person appointments. The patient expressed understanding and agreed to proceed.  I discussed the assessment and treatment plan with the patient. The patient was provided an opportunity to ask questions and all were answered. The patient agreed with the plan and demonstrated an understanding of the instructions.   The patient was advised to call back or seek an in-person evaluation if the symptoms worsen or if the condition fails to improve as anticipated.  I provided 52 minutes of non-face-to-face time during this encounter. Lindsey Morrison, [facility]  Session Time: 2:01 PM - 2:53 PM  Participation Level: Active  Behavioral Response: Casual, Alert, Anxious  Type of Therapy: Individual Therapy  Treatment Goals addressed:  Active Anxiety  LTG: I want to figure out what's wrong with me and why I think the way I think. I'm very impulsive. I want to find solutions to my problems without spiraling over things. Just figuring out my brain and why I feel the way I feel.                 Start:  05/24/23    Expected End:  05/22/24      STG: Report a decrease in anxiety and depression symptoms as evidenced by an overall reduction in score by a minimum of 25% on the GAD-7 and PHQ-9    STG: Just trying to get a grasp of my emotions and why I'm so moody and all over the place. Improve emotional intelligence AEB ability to identify emotions and implement emotion regulation skills and cognitive restructuring over the next 12 weeks.     Demonstrates progress in stages of grief at own pace  ProgressTowards Goals:  Progressing  Interventions: Motivational Interviewing, Solution Focused, Assertiveness Training, and Supportive  Summary: Lindsey Morrison is a 18 y.o. female who presents with a history of anxiety and depression. She appeared alert and oriented x5. She reported she isn't feeling well and that's why she switched to virtual for today. Lindsey Morrison noted she has started school and it's going well so far. She discussed potential plans for future education and career. She expressed concerns around certain friendships and how to have healthier boundaries for herself.    Therapist Response: Conducted session with Lindsey Morrison. Began session with check-in/update since previous session. Utilized empathetic and reflective listening. Used open-ended questions to facilitate discussion and summarized Lindsey Morrison's thoughts/feelings. She noted plans for the future and hopes to building better routines and boundaries for herself. Scheduled additional appointment and concluded session.   Suicidal/Homicidal: No  Plan: Return again in 2 weeks.  Diagnosis: GAD (generalized anxiety disorder)  Collaboration of Care: Medication Management AEB chart review  Patient/Guardian was advised Release of Information must be obtained prior to any record release in order to collaborate their care with an outside provider. Patient/Guardian was advised if they have not already done so to contact the registration department to sign all necessary forms in order for us  to release information regarding their care.   Consent: Patient/Guardian gives verbal consent for treatment and assignment of benefits for services provided during this visit. Patient/Guardian expressed understanding and agreed to proceed.   Lindsey  JONETTA Morrison, [facility] 01/10/2024

## 2024-01-23 ENCOUNTER — Ambulatory Visit (INDEPENDENT_AMBULATORY_CARE_PROVIDER_SITE_OTHER): Admitting: Professional Counselor

## 2024-01-23 DIAGNOSIS — F411 Generalized anxiety disorder: Secondary | ICD-10-CM

## 2024-01-23 NOTE — Progress Notes (Signed)
 THERAPIST PROGRESS NOTE  Virtual Visit via Video Note  I connected with Lindsey Morrison on 01/23/24 at 11:00 AM EDT by a video enabled telemedicine application and verified that I am speaking with the correct person using two identifiers.  Location: Patient: Home Provider: Remote office   I discussed the limitations of evaluation and management by telemedicine and the availability of in person appointments. The patient expressed understanding and agreed to proceed.   I discussed the assessment and treatment plan with the patient. The patient was provided an opportunity to ask questions and all were answered. The patient agreed with the plan and demonstrated an understanding of the instructions.   The patient was advised to call back or seek an in-person evaluation if the symptoms worsen or if the condition fails to improve as anticipated.  I provided 55 minutes of non-face-to-face time during this encounter. Lindsey Morrison, [facility]  Session Time: 11:00 AM - 11:55 AM   Participation Level: Active  Behavioral Response: Casual, Alert, Anxious  Type of Therapy: Individual Therapy  Treatment Goals addressed: Active Anxiety  LTG: I want to figure out what's wrong with me and why I think the way I think. I'm very impulsive. I want to find solutions to my problems without spiraling over things. Just figuring out my brain and why I feel the way I feel.  (Progressing)    Start:  05/24/23    Expected End:  05/22/24    Goal Note Reviewed 01/23/24 I feel like that's similar to my short term goal. I can sometimes regulate my emotions, but most of the time, I'm just all over the place. I need to not be all over the place and just do better.  STG: Report a decrease in anxiety and depression symptoms as evidenced by an overall reduction in score by a minimum of 25% on the GAD-7 and PHQ-9 (Completed/Met)  Goal Note Scores have improved Revised goal is to reduce scores to mild sxs over the  next 12 weeks  STG: Just trying to get a grasp of my emotions and why I'm so moody and all over the place. Improve emotional intelligence AEB ability to identify emotions and implement emotion regulation skills and cognitive restructuring over the next 12 weeks.  (Progressing)  Goal Note Reviewed 01/23/24 I feel like it's definitely better, but it's definitely not great. I feel like I communicate my emotions better than I did before, I just feel like my emotions are very intense and I don't regulate them or understand them myself.  Demonstrates progress in stages of grief at own pace (Completed/Met)  Goal Note Reviewed 01/23/24 - I feel like I've worked through some of them. I don't really think about it like I used to.  ProgressTowards Goals: Progressing  Interventions: Motivational Interviewing, Solution Focused, and Supportive  Summary: Blaire J Husak is a 18 y.o. female who presents with a history of anxiety and depression. She appeared alert and oriented x5. She stated school is going well and her grades have improved. She noted school is a little boring but some of that is due to repeating classes. Lindsey Morrison discussed possible plans for transferring to another college/university. She also discussed possible plans for employment. She was receptive to gathering information instead of making assumptions, so she can make better choices about future plans. Lindsey Morrison noted progress on goals and areas for continued improvement.   Therapist Response: Conducted session with Lindsey Morrison. Began session with check-in/update since previous session. Utilized empathetic and reflective listening.  Used open-ended questions to facilitate discussion and summarized Lindsey Morrison's thoughts/feelings. Explored possible plans for education and career/employment. Encouraged Lindsey Morrison to gather information/research rather than assuming or thinking the worst of situations. Reviewed treatment plan with input from Lindsey Morrison on current strengths,  needs, and progress towards goals. Scheduled additional appointment and concluded session.   Suicidal/Homicidal: No  Plan: Return again in 2 weeks.  Diagnosis: GAD (generalized anxiety disorder)  Collaboration of Care: Medication Management AEB chart review  Patient/Guardian was advised Release of Information must be obtained prior to any record release in order to collaborate their care with an outside provider. Patient/Guardian was advised if they have not already done so to contact the registration department to sign all necessary forms in order for us  to release information regarding their care.   Consent: Patient/Guardian gives verbal consent for treatment and assignment of benefits for services provided during this visit. Patient/Guardian expressed understanding and agreed to proceed.   Lindsey Morrison, [facility] 01/23/2024

## 2024-01-24 ENCOUNTER — Encounter: Payer: Self-pay | Admitting: Psychiatry

## 2024-01-24 ENCOUNTER — Other Ambulatory Visit: Payer: Self-pay

## 2024-01-24 ENCOUNTER — Ambulatory Visit (INDEPENDENT_AMBULATORY_CARE_PROVIDER_SITE_OTHER): Admitting: Psychiatry

## 2024-01-24 VITALS — BP 124/75 | HR 64 | Temp 97.6°F | Ht 65.0 in | Wt 239.8 lb

## 2024-01-24 DIAGNOSIS — F325 Major depressive disorder, single episode, in full remission: Secondary | ICD-10-CM | POA: Diagnosis not present

## 2024-01-24 DIAGNOSIS — F411 Generalized anxiety disorder: Secondary | ICD-10-CM | POA: Diagnosis not present

## 2024-01-24 NOTE — Progress Notes (Unsigned)
 BH MD OP Progress Note  01/24/2024 5:13 PM Lindsey Morrison  MRN:  [id number]  Chief Complaint:  Chief Complaint  Patient presents with   Follow-up   Anxiety   Medication Refill  Discussed the use of AI scribe software for clinical note transcription with the patient, who gave verbal consent to proceed.  History of Present Illness Lindsey Morrison is a 18 year old biracial female, single, has a history of GAD, depression, asthma, menstrual irregularities was evaluated in office today for a follow-up appointment.  Currently reports she has episodic sadness and anxiety due to situational stressors.  To cope, she leaves the house more often, which temporarily improves her mood. She identifies her hygiene as an area of concern, stating that she has not kept it optimal recently. She maintains strong academic performance, earning A grades in all her classes, and emphasizes her determination to prevent her mood from interfering with her schoolwork. She expresses hope that starting a new job and establishing a routine will help improve her mood and daily functioning.  She recounts a panic attack after injuring her shoulder few weeks ago, during which she became convinced that something was seriously wrong and began to hyperventilate and cry. She took hydroxyzine  as needed during this episode, which helped her calm down and fall asleep.. She notes that she has not needed to use hydroxyzine  in a long time, except for the episode described above.  She acknowledges that she is getting sufficient sleep .  She is motivated to stay in psychotherapy sessions.  She is compliant on the Wellbutrin .  Denies side effects.  She denies any suicidality, homicidality or perceptual disturbances.    Visit Diagnosis:    ICD-10-CM   1. GAD (generalized anxiety disorder)  F41.1     2. Major depressive disorder with single episode, in full remission (HCC)  F32.5       Past Psychiatric History: I have reviewed  past psychiatric history from progress note on 05/08/2023.  Past trials of medications like Zoloft-made her worse, Prozac, Lexapro, trazodone, Effexor -noncompliant.  Past Medical History:  Past Medical History:  Diagnosis Date   Ankle fracture, right    Anxiety    Asthma    Depression    History reviewed. No pertinent surgical history.  Family Psychiatric History: I have reviewed family psychiatric history from progress note on 05/08/2023.  Family History:  Family History  Problem Relation Age of Onset   Depression Mother    Anxiety disorder Mother    Drug abuse Mother    Drug abuse Father    Drug abuse Maternal Uncle    Suicidality Maternal Uncle    Schizophrenia Other     Social History: I have reviewed social history from progress note on 05/08/2023. Social History   Socioeconomic History   Marital status: Single    Spouse name: Not on file   Number of children: Not on file   Years of education: Not on file   Highest education level: High school graduate  Occupational History   Not on file  Tobacco Use   Smoking status: Never   Smokeless tobacco: Never  Vaping Use   Vaping status: Every Day   Substances: Nicotine, Flavoring  Substance and Sexual Activity   Alcohol use: Not Currently   Drug use: Yes    Types: Marijuana   Sexual activity: Yes    Birth control/protection: Pill, Condom  Other Topics Concern   Not on file  Social History Narrative   Not on  file   Social Drivers of Health   Financial Resource Strain: Low Risk  (05/17/2023)   Overall Financial Resource Strain (CARDIA)    Difficulty of Paying Living Expenses: Not hard at all  Recent Concern: Financial Resource Strain - Medium Risk (02/20/2023)   Received from [facility]   Overall Financial Resource Strain (CARDIA)    Difficulty of Paying Living Expenses: Somewhat hard  Food Insecurity: No Food Insecurity (05/17/2023)   Hunger Vital Sign    Worried About Running Out of Food in  the Last Year: Never true    Ran Out of Food in the Last Year: Never true  Transportation Needs: No Transportation Needs (05/17/2023)   PRAPARE - Administrator, Civil Service (Medical): No    Lack of Transportation (Non-Medical): No  Physical Activity: Sufficiently Active (05/17/2023)   Exercise Vital Sign    Days of Exercise per Week: 3 days    Minutes of Exercise per Session: 60 min  Stress: Stress Concern Present (05/17/2023)   [employer] of Occupational Health - Occupational Stress Questionnaire    Feeling of Stress : To some extent  Social Connections: Moderately Isolated (05/17/2023)   Social Connection and Isolation Panel    Frequency of Communication with Friends and Family: More than three times a week    Frequency of Social Gatherings with Friends and Family: More than three times a week    Attends Religious Services: 1 to 4 times per year    Active Member of [employer] or Organizations: No    Attends Banker Meetings: Never    Marital Status: Never married    Allergies:  Allergies  Allergen Reactions   Cat Dander Shortness Of Breath   Amoxicillin Rash    Metabolic Disorder Labs: No results found for: HGBA1C, MPG No results found for: PROLACTIN No results found for: CHOL, TRIG, HDL, CHOLHDL, VLDL, LDLCALC No results found for: TSH  Therapeutic Level Labs: No results found for: LITHIUM No results found for: VALPROATE No results found for: CBMZ  Current Medications: Current Outpatient Medications  Medication Sig Dispense Refill   albuterol (VENTOLIN HFA) 108 (90 Base) MCG/ACT inhaler Inhale into the lungs.     buPROPion  (WELLBUTRIN ) 75 MG tablet TAKE 1 TABLET(75 MG) BY MOUTH IN THE MORNING 90 tablet 0   cetirizine (ZYRTEC) 10 MG tablet Take 10 mg by mouth daily.     fluticasone (FLONASE) 50 MCG/ACT nasal spray Place into the nose.     hydrOXYzine  (ATARAX ) 25 MG tablet Take 1 tablet (25 mg total) by mouth 2 (two)  times daily as needed. For severe anxiety attacks , please limit use 60 tablet 1   sodium fluoride  (FLUORISHIELD) 1.1 % GEL dental gel BRUSH ONCE DAILY IN PLACE OF REGULAR TOOTHPASTE     No current facility-administered medications for this visit.     Musculoskeletal: Strength & Muscle Tone: within normal limits Gait & Station: normal Patient leans: N/A  Psychiatric Specialty Exam: Review of Systems  Psychiatric/Behavioral:         Episodic low mood    Blood pressure 124/75, pulse 64, temperature 97.6 F (36.4 C), temperature source Temporal, height 5' 5 (1.651 m), weight 239 lb 12.8 oz (108.8 kg).Body mass index is 39.9 kg/m.  General Appearance: Fairly Groomed  Eye Contact:  Fair  Speech:  Normal Rate  Volume:  Normal  Mood:  over all ok with episodic low mood who who who  Affect:  Congruent  Thought Process:  Goal Directed and Descriptions of Associations: Intact  Orientation:  Full (Time, Place, and Person)  Thought Content: Logical   Suicidal Thoughts:  No  Homicidal Thoughts:  No  Memory:  Immediate;   Fair Recent;   Fair Remote;   Fair  Judgement:  Fair  Insight:  Fair  Psychomotor Activity:  Normal  Concentration:  Concentration: Fair and Attention Span: Fair  Recall:  [employer] of Knowledge: Fair  Language: Fair  Akathisia:  No  Handed:  Right  AIMS (if indicated): not done  Assets:  Communication Skills Desire for Improvement Housing Talents/Skills Transportation  ADL's:  Intact  Cognition: WNL  Sleep:  Fair   Screenings: GAD-7    Advertising copywriter from 01/23/2024 in [facility] Counselor from 06/23/2023 in [facility] Counselor from 05/17/2023 in [facility] Office Visit from 05[facility]08/2023 in [facility]  Total GAD-7 Score 9 6 19 21    PHQ2-9    Flowsheet Row Counselor from 01/23/2024  in [facility] Office Visit from 06/26/2023 in [facility] Counselor from 06/23/2023 in [facility] Counselor from 05/17/2023 in [facility] Office Visit from 05/08/2023 in [facility]  PHQ-2 Total Score 4 3 2 6 6   PHQ-9 Total Score 12 16 6 26 22    Flowsheet Row Video Visit from 10/16/2023 in [facility] Video Visit from 08/04/2023 in [facility] Office Visit from 06/26/2023 in [facility]  C-SSRS RISK CATEGORY No Risk No Risk No Risk     Assessment and Plan: Kerstyn J Aye is a 18 year old biracial female with history of depression, anxiety was evaluated in office today.  Discussed assessment and plan as noted below.  1. GAD (generalized anxiety disorder)-improving Currently does have situational anxiety although she is doing well overall. Continue hydroxyzine  25 mg twice a day as needed. Continue CBT with Ms. Veva.  2. Major depressive disorder with single episode, in full remission (HCC) Currently reports episodic sadness although it does not affect her functioning.  Planning to start a job soon and work on her schedule and will get back to this provider if she needs any changes with medications. Continue Wellbutrin  75 mg daily Continue CBT   Follow-up Follow-up in clinic in 6 to 7 weeks or sooner if needed.   Collaboration of Care: Collaboration of Care: Referral or follow-up with counselor/therapist AEB  encouraged to continue psychotherapy sessions.  Patient/Guardian was advised Release of Information must be obtained prior to any record release in order to collaborate their care with an outside provider. Patient/Guardian was advised if they have not already done  so to contact the registration department to sign all necessary forms in order for us  to release information regarding their care.   Consent: Patient/Guardian gives verbal consent for treatment and assignment of benefits for services provided during this visit. Patient/Guardian expressed understanding and agreed to proceed.   This note was generated in part or whole with voice recognition software. Voice recognition is usually quite accurate but there are transcription errors that can and very often do occur. I apologize for any typographical errors that were not detected and corrected.    Daylon Lafavor, MD 01/24/2024, 5:13 PM

## 2024-01-26 DIAGNOSIS — Z131 Encounter for screening for diabetes mellitus: Secondary | ICD-10-CM | POA: Diagnosis not present

## 2024-01-26 DIAGNOSIS — G43009 Migraine without aura, not intractable, without status migrainosus: Secondary | ICD-10-CM | POA: Diagnosis not present

## 2024-01-26 DIAGNOSIS — E611 Iron deficiency: Secondary | ICD-10-CM | POA: Diagnosis not present

## 2024-01-26 DIAGNOSIS — Z13220 Encounter for screening for lipoid disorders: Secondary | ICD-10-CM | POA: Diagnosis not present

## 2024-01-26 DIAGNOSIS — F32A Depression, unspecified: Secondary | ICD-10-CM | POA: Diagnosis not present

## 2024-01-26 DIAGNOSIS — F419 Anxiety disorder, unspecified: Secondary | ICD-10-CM | POA: Diagnosis not present

## 2024-01-26 DIAGNOSIS — Z1329 Encounter for screening for other suspected endocrine disorder: Secondary | ICD-10-CM | POA: Diagnosis not present

## 2024-02-06 ENCOUNTER — Ambulatory Visit: Admitting: Professional Counselor

## 2024-02-06 DIAGNOSIS — F325 Major depressive disorder, single episode, in full remission: Secondary | ICD-10-CM | POA: Diagnosis not present

## 2024-02-06 DIAGNOSIS — F411 Generalized anxiety disorder: Secondary | ICD-10-CM

## 2024-02-06 NOTE — Progress Notes (Signed)
 THERAPIST PROGRESS NOTE  Virtual Visit via Video Note  I connected with Yashika J Goldbach on 02/06/24 at  3:00 PM EDT by a video enabled telemedicine application and verified that I am speaking with the correct person using two identifiers.  Location: Patient: Community (parked car) Provider: Office   I discussed the limitations of evaluation and management by telemedicine and the availability of in person appointments. The patient expressed understanding and agreed to proceed.  I discussed the assessment and treatment plan with the patient. The patient was provided an opportunity to ask questions and all were answered. The patient agreed with the plan and demonstrated an understanding of the instructions.   The patient was advised to call back or seek an in-person evaluation if the symptoms worsen or if the condition fails to improve as anticipated.  I provided 28 minutes of non-face-to-face time during this encounter. [name], [facility]  Session Time: 3:02 PM - 3:30 PM   Participation Level: Active  Behavioral Response: Casual, Alert, Anxious  Type of Therapy: Individual Therapy  Treatment Goals addressed: Active Anxiety  LTG: I want to figure out what's wrong with me and why I think the way I think. I'm very impulsive. I want to find solutions to my problems without spiraling over things. Just figuring out my brain and why I feel the way I feel.  (Progressing)                Start:  05/24/23    Expected End:  05/22/24    Goal Note Reviewed 01/23/24 I feel like that's similar to my short term goal. I can sometimes regulate my emotions, but most of the time, I'm just all over the place. I need to not be all over the place and just do better.   STG: Report a decrease in anxiety and depression symptoms as evidenced by an overall reduction in score by a minimum of 25% on the GAD-7 and PHQ-9 (Completed/Met)  Goal Note Scores have improved Revised goal is to reduce scores to  mild sxs over the next 12 weeks   STG: Just trying to get a grasp of my emotions and why I'm so moody and all over the place. Improve emotional intelligence AEB ability to identify emotions and implement emotion regulation skills and cognitive restructuring over the next 12 weeks.  (Progressing)  Goal Note Reviewed 01/23/24 I feel like it's definitely better, but it's definitely not great. I feel like I communicate my emotions better than I did before, I just feel like my emotions are very intense and I don't regulate them or understand them myself.   Demonstrates progress in stages of grief at own pace (Completed/Met)  Goal Note Reviewed 01/23/24 - I feel like I've worked through some of them. I don't really think about it like I used to.  ProgressTowards Goals: Progressing  Interventions: Motivational Interviewing, Conservator, museum/gallery, and Supportive  Summary: Marja J Stoy is a 18 y.o. female who presents with a history of anxiety and depression. She appeared alert and oriented x5. She stated things are going okay. She reported she got a job at [employer]. 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 She was receptive to assertiveness tips. [name] reported school is going well but she does have to complete an essay today that she procrastinated on. She noted she still hasn't cleaned her room like she wanted to. She was receptive to chunking  again, to break down school and home tasks.  Therapist Response: Conducted session with [name]. Began session with check-in/update since previous session. Utilized empathetic and reflective listening. Used open-ended questions to facilitate discussion and summarized [name]'s thoughts/feelings. Modeled assertive statements and encouraged [name] to use with work situations. Reminded [name] of chunking to help break down overwhelming tasks, at home and school. Scheduled additional appointment and concluded  session.   Suicidal/Homicidal: No  Plan: Return again in 2 weeks.  Diagnosis: GAD (generalized anxiety disorder)  Major depressive disorder with single episode, in full remission  Collaboration of Care: Medication Management AEB chart review  Patient/Guardian was advised Release of Information must be obtained prior to any record release in order to collaborate their care with an outside provider. Patient/Guardian was advised if they have not already done so to contact the registration department to sign all necessary forms in order for us  to release information regarding their care.   Consent: Patient/Guardian gives verbal consent for treatment and assignment of benefits for services provided during this visit. Patient/Guardian expressed understanding and agreed to proceed.   [name], [facility] 02/06/2024

## 2024-02-14 DIAGNOSIS — G43009 Migraine without aura, not intractable, without status migrainosus: Secondary | ICD-10-CM | POA: Diagnosis not present

## 2024-02-14 DIAGNOSIS — Z131 Encounter for screening for diabetes mellitus: Secondary | ICD-10-CM | POA: Diagnosis not present

## 2024-02-14 DIAGNOSIS — Z13220 Encounter for screening for lipoid disorders: Secondary | ICD-10-CM | POA: Diagnosis not present

## 2024-02-14 DIAGNOSIS — F419 Anxiety disorder, unspecified: Secondary | ICD-10-CM | POA: Diagnosis not present

## 2024-02-14 DIAGNOSIS — F32A Depression, unspecified: Secondary | ICD-10-CM | POA: Diagnosis not present

## 2024-02-14 DIAGNOSIS — E611 Iron deficiency: Secondary | ICD-10-CM | POA: Diagnosis not present

## 2024-02-20 ENCOUNTER — Ambulatory Visit: Admitting: Professional Counselor

## 2024-02-21 ENCOUNTER — Ambulatory Visit: Admitting: Professional Counselor

## 2024-02-21 DIAGNOSIS — Z130 Encounter for screening for diseases of the blood and blood-forming organs and certain disorders involving the immune mechanism: Secondary | ICD-10-CM | POA: Diagnosis not present

## 2024-02-21 DIAGNOSIS — Z0000 Encounter for general adult medical examination without abnormal findings: Secondary | ICD-10-CM | POA: Diagnosis not present

## 2024-02-21 DIAGNOSIS — F32A Depression, unspecified: Secondary | ICD-10-CM | POA: Diagnosis not present

## 2024-02-21 DIAGNOSIS — Z131 Encounter for screening for diabetes mellitus: Secondary | ICD-10-CM | POA: Diagnosis not present

## 2024-02-21 DIAGNOSIS — Z13220 Encounter for screening for lipoid disorders: Secondary | ICD-10-CM | POA: Diagnosis not present

## 2024-02-21 DIAGNOSIS — F411 Generalized anxiety disorder: Secondary | ICD-10-CM

## 2024-02-21 DIAGNOSIS — F325 Major depressive disorder, single episode, in full remission: Secondary | ICD-10-CM | POA: Diagnosis not present

## 2024-02-21 DIAGNOSIS — J4541 Moderate persistent asthma with (acute) exacerbation: Secondary | ICD-10-CM | POA: Diagnosis not present

## 2024-02-21 DIAGNOSIS — Z1331 Encounter for screening for depression: Secondary | ICD-10-CM | POA: Diagnosis not present

## 2024-02-21 DIAGNOSIS — N921 Excessive and frequent menstruation with irregular cycle: Secondary | ICD-10-CM | POA: Diagnosis not present

## 2024-02-21 DIAGNOSIS — J301 Allergic rhinitis due to pollen: Secondary | ICD-10-CM | POA: Diagnosis not present

## 2024-02-21 DIAGNOSIS — F419 Anxiety disorder, unspecified: Secondary | ICD-10-CM | POA: Diagnosis not present

## 2024-02-21 DIAGNOSIS — Z1329 Encounter for screening for other suspected endocrine disorder: Secondary | ICD-10-CM | POA: Diagnosis not present

## 2024-02-21 NOTE — Progress Notes (Unsigned)
  THERAPIST PROGRESS NOTE  Virtual Visit via Video Note  I connected with Pattie J Sian on 02/21/24 at  1:00 PM EDT by a video enabled telemedicine application and verified that I am speaking with the correct person using two identifiers.  Location: Patient: Community (parked car)  Provider: Office   I discussed the limitations of evaluation and management by telemedicine and the availability of in person appointments. The patient expressed understanding and agreed to proceed.  I discussed the assessment and treatment plan with the patient. The patient was provided an opportunity to ask questions and all were answered. The patient agreed with the plan and demonstrated an understanding of the instructions.   The patient was advised to call back or seek an in-person evaluation if the symptoms worsen or if the condition fails to improve as anticipated.  I provided 43 minutes of non-face-to-face time during this encounter. [name], [facility]  Session Time: 1:01 PM - 1:44 PM   Participation Level: {BHH PARTICIPATION LEVEL:22264}  Behavioral Response: {Appearance:22683}{BHH LEVEL OF CONSCIOUSNESS:22305}{BHH MOOD:22306}  Type of Therapy: {CHL AMB BH Type of Therapy:21022741}  Treatment Goals addressed: ***  ProgressTowards Goals: {Progress Towards Goals:21014066}  Interventions: {CHL AMB BH Type of Intervention:21022753}  Summary: Kemper J Storrs is a 18 y.o. female who presents with ***.   Suicidal/Homicidal: {BHH YES OR NO:22294}{yes/no/with/without intent/plan:22693}  Therapist Response: ***  Plan: Return again in *** weeks.  Diagnosis: GAD (generalized anxiety disorder)  Major depressive disorder with single episode, in full remission  Collaboration of Care: {BH OP Collaboration of Care:21014065}  Patient/Guardian was advised Release of Information must be obtained prior to any record release in order to collaborate their care with an outside provider.  Patient/Guardian was advised if they have not already done so to contact the registration department to sign all necessary forms in order for us  to release information regarding their care.   Consent: Patient/Guardian gives verbal consent for treatment and assignment of benefits for services provided during this visit. Patient/Guardian expressed understanding and agreed to proceed.   [name], [facility] 02/21/2024

## 2024-03-05 ENCOUNTER — Other Ambulatory Visit: Payer: Self-pay

## 2024-03-05 ENCOUNTER — Encounter: Payer: Self-pay | Admitting: Psychiatry

## 2024-03-05 ENCOUNTER — Ambulatory Visit (INDEPENDENT_AMBULATORY_CARE_PROVIDER_SITE_OTHER): Admitting: Psychiatry

## 2024-03-05 VITALS — BP 113/72 | HR 55 | Temp 97.3°F | Ht 65.0 in | Wt 235.0 lb

## 2024-03-05 DIAGNOSIS — F411 Generalized anxiety disorder: Secondary | ICD-10-CM

## 2024-03-05 DIAGNOSIS — F330 Major depressive disorder, recurrent, mild: Secondary | ICD-10-CM | POA: Diagnosis not present

## 2024-03-05 MED ORDER — BUPROPION HCL ER (XL) 150 MG PO TB24: 150.0000 mg | ORAL_TABLET | Freq: Every morning | ORAL | 1 refills | Status: AC

## 2024-03-05 NOTE — Progress Notes (Signed)
 [facility] MD OP Progress Note  03/05/2024 5:03 PM Lindsey Morrison  MRN:  [id number]  Chief Complaint:  Chief Complaint  Patient presents with   Follow-up   Anxiety   Depression   Medication Refill   Discussed the use of AI scribe software for clinical note transcription with the patient, who gave verbal consent to proceed.  History of Present Illness CHRYSTLE MURILLO is a 18 year old biracial female, single, has a history of GAD, depression, asthma, menstrual irregularities was evaluated in office today for follow-up appointment.  Over the past month, she has experienced unstable mood, describing it as a 'roller coaster.' While she maintains a 4.0 GPA and performs well academically, she feels disorganized and struggles with daily routines, including inconsistent adherence to her medication regimen and missing basic self-care tasks such as brushing her teeth. She connects some of these challenges to her busy schedule balancing work and school.  Increased irritability and feeling 'snappy' have affected her romantic relationships and interactions with close friends, resulting in arguments. She expresses uncertainty about whether these interpersonal difficulties stem from her mood or stress related to life circumstances. Ongoing anxiety persists, and she describes herself as 'anxious all the time,' with persistent fears about dying, even after recent reassuring medical evaluations. Minor setbacks, such as receiving a poor grade on an assignment, cause her significant distress and catastrophic thinking, though she recovers her academic standing quickly.  Disrupted sleep patterns have led her to stay at a friend's house rather than her own. She notes improved sleep quality when not at home, but her sleep schedule remains inconsistent due to varying work start times and social activities, sometimes resulting in insufficient rest.  Her current medications include Wellbutrin  at 75 mg and hydroxyzine  as  needed. She feels uncertain about whether her current medication regimen adequately addresses her symptoms and remains open to adjustments. Anxiety related to the idea of taking more than one medication, including fears about overdose, has prompted her to plan a discussion with her therapist.  She has upcoming appointment with Ms. Rhae.   She denies any suicidality, homicidality or perceptual disturbances.   Visit Diagnosis:    ICD-10-CM   1. GAD (generalized anxiety disorder)  F41.1 buPROPion  (WELLBUTRIN  XL) 150 MG 24 hr tablet    2. MDD (major depressive disorder), recurrent episode, mild  F33.0 buPROPion  (WELLBUTRIN  XL) 150 MG 24 hr tablet      Past Psychiatric History: I have reviewed past psychiatric history from progress note on 05/08/2023.  Past trials of medications like Zoloft-made her worse, Prozac, Lexapro, trazodone, Effexor -noncompliant  Past Medical History:  Past Medical History:  Diagnosis Date   Ankle fracture, right    Anxiety    Asthma    Depression    History reviewed. No pertinent surgical history.  Family Psychiatric History: I reviewed family psychiatric history from progress note on 05/08/2023.  Family History:  Family History  Problem Relation Age of Onset   Depression Mother    Anxiety disorder Mother    Drug abuse Mother    Drug abuse Father    Drug abuse Maternal Uncle    Suicidality Maternal Uncle    Schizophrenia Other     Social History: I have reviewed social history from progress note on 05/08/2023. Social History   Socioeconomic History   Marital status: Single    Spouse name: Not on file   Number of children: Not on file   Years of education: Not on file   Highest education level:  High school graduate  Occupational History   Not on file  Tobacco Use   Smoking status: Never   Smokeless tobacco: Never  Vaping Use   Vaping status: Every Day   Substances: Nicotine, Flavoring  Substance and Sexual Activity   Alcohol use: Not  Currently   Drug use: Yes    Types: Marijuana   Sexual activity: Yes    Birth control/protection: Pill, Condom  Other Topics Concern   Not on file  Social History Narrative   Not on file   Social Drivers of Health   Financial Resource Strain: Patient Declined (02/21/2024)   Received from [facility]   Overall Financial Resource Strain (CARDIA)    Difficulty of Paying Living Expenses: Patient declined  Food Insecurity: Patient Declined (02/21/2024)   Received from [facility]   Hunger Vital Sign    Within the past 12 months, you worried that your food would run out before you got the money to buy more.: Patient declined    Within the past 12 months, the food you bought just didn't last and you didn't have money to get more.: Patient declined  Transportation Needs: Patient Declined (02/21/2024)   Received from [facility] - Transportation    In the past 12 months, has lack of transportation kept you from medical appointments or from getting medications?: Patient declined    Lack of Transportation (Non-Medical): Patient declined  Physical Activity: Sufficiently Active (05/17/2023)   Exercise Vital Sign    Days of Exercise per Week: 3 days    Minutes of Exercise per Session: 60 min  Stress: Stress Concern Present (05/17/2023)   [employer] of Occupational Health - Occupational Stress Questionnaire    Feeling of Stress : To some extent  Social Connections: Moderately Isolated (05/17/2023)   Social Connection and Isolation Panel    Frequency of Communication with Friends and Family: More than three times a week    Frequency of Social Gatherings with Friends and Family: More than three times a week    Attends Religious Services: 1 to 4 times per year    Active Member of [employer] or Organizations: No    Attends Banker Meetings: Never    Marital Status: Never married    Allergies:  Allergies  Allergen  Reactions   Cat Dander Shortness Of Breath   Amoxicillin Rash    Metabolic Disorder Labs: No results found for: HGBA1C, MPG No results found for: PROLACTIN No results found for: CHOL, TRIG, HDL, CHOLHDL, VLDL, LDLCALC No results found for: TSH  Therapeutic Level Labs: No results found for: LITHIUM No results found for: VALPROATE No results found for: CBMZ  Current Medications: Current Outpatient Medications  Medication Sig Dispense Refill   buPROPion  (WELLBUTRIN  XL) 150 MG 24 hr tablet Take 1 tablet (150 mg total) by mouth in the morning. 30 tablet 1   montelukast (SINGULAIR) 10 MG tablet Take 10 mg by mouth at bedtime.     albuterol (VENTOLIN HFA) 108 (90 Base) MCG/ACT inhaler Inhale into the lungs.     cetirizine (ZYRTEC) 10 MG tablet Take 10 mg by mouth daily.     fluticasone (FLONASE) 50 MCG/ACT nasal spray Place into the nose.     hydrOXYzine  (ATARAX ) 25 MG tablet Take 1 tablet (25 mg total) by mouth 2 (two) times daily as needed. For severe anxiety attacks , please limit use 60 tablet 1   sodium fluoride  (FLUORISHIELD) 1.1 % GEL  dental gel BRUSH ONCE DAILY IN PLACE OF REGULAR TOOTHPASTE     No current facility-administered medications for this visit.     Musculoskeletal: Strength & Muscle Tone: within normal limits Gait & Station: normal Patient leans: N/A  Psychiatric Specialty Exam: Review of Systems  Psychiatric/Behavioral:  Positive for dysphoric mood and sleep disturbance. The patient is nervous/anxious.     Blood pressure 113/72, pulse (!) 55, temperature (!) 97.3 F (36.3 C), temperature source Temporal, height 5' 5 (1.651 m), weight 235 lb (106.6 kg).Body mass index is 39.11 kg/m.  General Appearance: Casual  Eye Contact:  Fair  Speech:  Clear and Coherent  Volume:  Normal  Mood:  Anxious and Depressed  Affect:  Appropriate  Thought Process:  Goal Directed and Descriptions of Associations: Intact  Orientation:  Full (Time,  Place, and Person)  Thought Content: Logical   Suicidal Thoughts:  No  Homicidal Thoughts:  No  Memory:  Immediate;   Fair Recent;   Fair Remote;   Fair  Judgement:  Fair  Insight:  Fair  Psychomotor Activity:  Normal  Concentration:  Concentration: Fair and Attention Span: Fair  Recall:  [employer] of Knowledge: Fair  Language: Fair  Akathisia:  No  Handed:  Right  AIMS (if indicated): not done  Assets:  Manufacturing Systems Engineer Desire for Improvement Housing Social Support Transportation  ADL's:  Intact  Cognition: WNL  Sleep:  Fair   Screenings: GAD-7    Garment/textile Technologist Visit from 03/05/2024 in [facility] Counselor from 01/23/2024 in [facility] Counselor from 06/23/2023 in [facility] Counselor from 05/17/2023 in [facility] Office Visit from 05/08/2023 in [facility]  Total GAD-7 Score 20 9 6 19 21    PHQ2-9    Flowsheet Row Office Visit from 03/05/2024 in [facility] Counselor from 01/23/2024 in [facility] Office Visit from 06/26/2023 in [facility] Counselor from 06/23/2023 in [facility] Counselor from 05/17/2023 in [facility]  PHQ-2 Total Score 4 4 3 2 6   PHQ-9 Total Score 17 12 16 6 26    Flowsheet Row Office Visit from 03/05/2024 in [facility] Office Visit from 01/24/2024 in [facility] Video Visit from 10/16/2023 in [facility]  C-SSRS RISK CATEGORY No Risk No Risk No Risk     Assessment and Plan: Clyde J Moorhouse is a 18 year old biracial female  with history of depression, anxiety was evaluated in office today, discussed assessment and plan as noted below.  1. GAD (generalized anxiety disorder)-unstable Currently with ongoing anxiety mostly generalized worrying about different things, catastrophizing.  Agreeable to continue psychotherapy session and has upcoming appointment tomorrow Continue CBT with Ms. Veva Continue hydroxyzine  25 mg twice a day as needed  2. MDD (major depressive disorder), recurrent episode, mild-unstable Does report depression symptoms irritability sleep problems mostly due to lack of sleep hygiene.  Interested in dosage increase of Wellbutrin . Increase Wellbutrin  XL 150 mg daily in the morning Consider addition of an SSRI or SNRI in the future. Continue CBT  Follow-up Follow-up in clinic in 4 weeks or sooner if needed.    Collaboration of Care: Collaboration of Care: Referral or follow-up with counselor/therapist AEB encouraged to continue psychotherapy sessions.  Patient/Guardian  was advised Release of Information must be obtained prior to any record release in order to collaborate their care with an outside provider. Patient/Guardian was advised if they have not already done so to contact the registration department to sign all necessary forms in order for us  to release information regarding their care.   Consent: Patient/Guardian gives verbal consent for treatment and assignment of benefits for services provided during this visit. Patient/Guardian expressed understanding and agreed to proceed.  This note was generated in part or whole with voice recognition software. Voice recognition is usually quite accurate but there are transcription errors that can and very often do occur. I apologize for any typographical errors that were not detected and corrected.     Uthman Mroczkowski, MD 03/05/2024, 5:03 PM

## 2024-03-06 ENCOUNTER — Ambulatory Visit (INDEPENDENT_AMBULATORY_CARE_PROVIDER_SITE_OTHER): Admitting: Professional Counselor

## 2024-03-06 DIAGNOSIS — F411 Generalized anxiety disorder: Secondary | ICD-10-CM

## 2024-03-06 DIAGNOSIS — F330 Major depressive disorder, recurrent, mild: Secondary | ICD-10-CM | POA: Diagnosis not present
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
## 2024-03-20 ENCOUNTER — Ambulatory Visit: Admitting: Professional Counselor

## 2024-03-20 DIAGNOSIS — F411 Generalized anxiety disorder: Secondary | ICD-10-CM | POA: Diagnosis not present

## 2024-03-20 DIAGNOSIS — F330 Major depressive disorder, recurrent, mild: Secondary | ICD-10-CM

## 2024-03-20 NOTE — Progress Notes (Unsigned)
 THERAPIST PROGRESS NOTE  Session Time: 4:02 PM - 4:52 PM   Participation Level: Active  Behavioral Response: Well Groomed, Alert, Anxious  Type of Therapy: Individual Therapy  Treatment Goals addressed: Active Anxiety  LTG: I want to figure out what's wrong with me and why I think the way I think. I'm very impulsive. I want to find solutions to my problems without spiraling over things. Just figuring out my brain and why I feel the way I feel.  (Progressing)                Start:  05/24/23    Expected End:  05/22/24    Goal Note Reviewed 01/23/24 I feel like that's similar to my short term goal. I can sometimes regulate my emotions, but most of the time, I'm just all over the place. I need to not be all over the place and just do better.   STG: Report a decrease in anxiety and depression symptoms as evidenced by an overall reduction in score by a minimum of 25% on the GAD-7 and PHQ-9 (Completed/Met)  Goal Note Scores have improved Revised goal is to reduce scores to mild sxs over the next 12 weeks   STG: Just trying to get a grasp of my emotions and why I'm so moody and all over the place. Improve emotional intelligence AEB ability to identify emotions and implement emotion regulation skills and cognitive restructuring over the next 12 weeks.  (Progressing)  Goal Note Reviewed 01/23/24 I feel like it's definitely better, but it's definitely not great. I feel like I communicate my emotions better than I did before, I just feel like my emotions are very intense and I don't regulate them or understand them myself.   Demonstrates progress in stages of grief at own pace (Completed/Met)  Goal Note Reviewed 01/23/24 - I feel like I've worked through some of them. I don't really think about it like I used to.  ProgressTowards Goals: Progressing  Interventions: CBT, Motivational Interviewing, and Supportive  Summary: Thersa J Caslin is a 18 y.o. female who presents with a history of  anxiety and depression. She appeared anxious but oriented x5. She noted she is still struggling with fear/rumination on death. Maddie shared numerous what if thoughts around this topic. She noted updates on education/career plans and provided reasons for making these changes. She reported her grandmother cleaned up her room, which was helpful for her. Maddie still isn't being compliant with medications consistently.  Therapist Response: Conducted session with Maddie. Began session with check-in/update since previous session. Utilized empathetic and reflective listening. Used open-ended questions to facilitate discussion and summarized Maddie's thoughts/feelings. Explored thoughts around death and encouraged Maddie to listen to Leaves on a Stream meditation to reduce attachment from thoughts. Engaged in brief ERP exercise to think about the roof caving in and reminded Maddie that thoughts are just thoughts and can't make things happen. Highlighted importance of medication compliance and discussed a possible need for ADHD evaluation. Scheduled additional appointment and concluded session.   Suicidal/Homicidal: No  Plan: Return again in 3 weeks.  Diagnosis: GAD (generalized anxiety disorder)  MDD (major depressive disorder), recurrent episode, mild  Collaboration of Care: Medication Management AEB chart review  Patient/Guardian was advised Release of Information must be obtained prior to any record release in order to collaborate their care with an outside provider. Patient/Guardian was advised if they have not already done so to contact the registration department to sign all necessary forms in order for us   to release information regarding their care.   Consent: Patient/Guardian gives verbal consent for treatment and assignment of benefits for services provided during this visit. Patient/Guardian expressed understanding and agreed to proceed.   [name], [facility] 03/20/2024

## 2024-04-03 ENCOUNTER — Encounter: Payer: Self-pay | Admitting: Psychiatry

## 2024-04-03 ENCOUNTER — Telehealth (INDEPENDENT_AMBULATORY_CARE_PROVIDER_SITE_OTHER): Admitting: Psychiatry

## 2024-04-03 DIAGNOSIS — F3341 Major depressive disorder, recurrent, in partial remission: Secondary | ICD-10-CM

## 2024-04-03 DIAGNOSIS — F411 Generalized anxiety disorder: Secondary | ICD-10-CM | POA: Diagnosis not present

## 2024-04-03 DIAGNOSIS — F330 Major depressive disorder, recurrent, mild: Secondary | ICD-10-CM | POA: Insufficient documentation

## 2024-04-03 NOTE — Progress Notes (Signed)
 Virtual Visit via Video Note  I connected with Lindsey Morrison on 04/03/24 at 11:00 AM EST by a video enabled telemedicine application and verified that I am speaking with the correct person using two identifiers.  Location Provider Location : [facility] Patient Location : Car  Participants: Patient , Provider    I discussed the limitations of evaluation and management by telemedicine and the availability of in person appointments. The patient expressed understanding and agreed to proceed.   I discussed the assessment and treatment plan with the patient. The patient was provided an opportunity to ask questions and all were answered. The patient agreed with the plan and demonstrated an understanding of the instructions.   The patient was advised to call back or seek an in-person evaluation if the symptoms worsen or if the condition fails to improve as anticipated.   BH MD OP Progress Note  04/03/2024 11:18 AM EPSIE WALTHALL  MRN:  [id number]  Chief Complaint:  Chief Complaint  Patient presents with   Medication Refill   Follow-up   Depression   Anxiety   Discussed the use of AI scribe software for clinical note transcription with the patient, who gave verbal consent to proceed.  History of Present Illness Lindsey Morrison is a 18 year old biracial female, single, has a history of GAD, depression, asthma, menstrual irregularities was evaluated by telemedicine today.  She reports improvement in mood and overall well-being since her last visit, and she reports that her Wellbutrin  XL dose was increased to 150 mg daily. She describes a significant reduction in persistent thoughts of death and dying, noting progress from both the medication adjustment and ongoing therapy with her therapist this Lindsey Morrison. She continues to attend therapy and finds it helpful.  She reports increased energy and enjoyment in daily activities. She reports sleeping well, typically going to bed early due to her  work schedule, and she is able to wake up early for the gym and maintain her daily routine. She notes that she no longer wakes up in the middle of the night and finds her sleep restorative. She describes a stable appetite, mentioning efforts to eat healthier due to increased physical activity, and she does not report any significant changes in weight.  She denies any thoughts of hurting herself or others.  Currently enrolled in school, she reports that school is going well and plans to take more classes next semester. She works at [employer] 4 days per week and also helps at the the [employer] on days off. She plans to reduce [employer] shifts to 2 days per week next semester to focus on school. She regularly goes to the gym and maintains a daily routine.     Visit Diagnosis:    ICD-10-CM   1. GAD (generalized anxiety disorder)  F41.1     2. Recurrent major depressive disorder, in partial remission  F33.41       Past Psychiatric History: I have reviewed past psychiatric history from progress note on 05/08/2023.  Past trials of medications like Zoloft-made her worse, Prozac, Lexapro, trazodone, Effexor -noncompliant.  Past Medical History:  Past Medical History:  Diagnosis Date   Ankle fracture, right    Anxiety    Asthma    Depression    History reviewed. No pertinent surgical history.  Family Psychiatric History: I have reviewed family psychiatric history from progress note on 05/08/2023.  Family History:  Family History  Problem Relation Age of Onset   Depression Mother    Anxiety  disorder Mother    Drug abuse Mother    Drug abuse Father    Drug abuse Maternal Uncle    Suicidality Maternal Uncle    Schizophrenia Other     Social History: I have reviewed social history from progress note on 05/08/2023. Social History   Socioeconomic History   Marital status: Single    Spouse name: Not on file   Number of children: Not on file   Years of education: Not on file    Highest education level: High school graduate  Occupational History   Not on file  Tobacco Use   Smoking status: Never   Smokeless tobacco: Never  Vaping Use   Vaping status: Every Day   Substances: Nicotine, Flavoring  Substance and Sexual Activity   Alcohol use: Not Currently   Drug use: Yes    Types: Marijuana   Sexual activity: Yes    Birth control/protection: Pill, Condom  Other Topics Concern   Not on file  Social History Narrative   Not on file   Social Drivers of Health   Financial Resource Strain: Patient Declined (02/21/2024)   Received from [facility]   Overall Financial Resource Strain (CARDIA)    Difficulty of Paying Living Expenses: Patient declined  Food Insecurity: Patient Declined (02/21/2024)   Received from [facility]   Hunger Vital Sign    Within the past 12 months, you worried that your food would run out before you got the money to buy more.: Patient declined    Within the past 12 months, the food you bought just didn't last and you didn't have money to get more.: Patient declined  Transportation Needs: Patient Declined (02/21/2024)   Received from [facility] - Transportation    In the past 12 months, has lack of transportation kept you from medical appointments or from getting medications?: Patient declined    Lack of Transportation (Non-Medical): Patient declined  Physical Activity: Sufficiently Active (05/17/2023)   Exercise Vital Sign    Days of Exercise per Week: 3 days    Minutes of Exercise per Session: 60 min  Stress: Stress Concern Present (05/17/2023)   [employer] of Occupational Health - Occupational Stress Questionnaire    Feeling of Stress : To some extent  Social Connections: Moderately Isolated (05/17/2023)   Social Connection and Isolation Panel    Frequency of Communication with Friends and Family: More than three times a week    Frequency of Social Gatherings  with Friends and Family: More than three times a week    Attends Religious Services: 1 to 4 times per year    Active Member of [employer] or Organizations: No    Attends Banker Meetings: Never    Marital Status: Never married    Allergies:  Allergies  Allergen Reactions   Cat Dander Shortness Of Breath   Amoxicillin Rash    Metabolic Disorder Labs: No results found for: HGBA1C, MPG No results found for: PROLACTIN No results found for: CHOL, TRIG, HDL, CHOLHDL, VLDL, LDLCALC No results found for: TSH  Therapeutic Level Labs: No results found for: LITHIUM No results found for: VALPROATE No results found for: CBMZ  Current Medications: Current Outpatient Medications  Medication Sig Dispense Refill   albuterol (VENTOLIN HFA) 108 (90 Base) MCG/ACT inhaler Inhale into the lungs.     buPROPion  (WELLBUTRIN  XL) 150 MG 24 hr tablet Take 1 tablet (150 mg total) by mouth in the morning.  30 tablet 1   cetirizine (ZYRTEC) 10 MG tablet Take 10 mg by mouth daily.     fluticasone (FLONASE) 50 MCG/ACT nasal spray Place into the nose.     hydrOXYzine  (ATARAX ) 25 MG tablet Take 1 tablet (25 mg total) by mouth 2 (two) times daily as needed. For severe anxiety attacks , please limit use 60 tablet 1   montelukast (SINGULAIR) 10 MG tablet Take 10 mg by mouth at bedtime.     sodium fluoride  (FLUORISHIELD) 1.1 % GEL dental gel BRUSH ONCE DAILY IN PLACE OF REGULAR TOOTHPASTE     No current facility-administered medications for this visit.     Musculoskeletal: Strength & Muscle Tone: UTA Gait & Station: Seated Patient leans: N/A  Psychiatric Specialty Exam: Review of Systems  Psychiatric/Behavioral:  The patient is nervous/anxious.     There were no vitals taken for this visit.There is no height or weight on file to calculate BMI.  General Appearance: Casual  Eye Contact:  Fair  Speech:  Clear and Coherent  Volume:  Normal  Mood:  Anxious  Affect:   Congruent  Thought Process:  Goal Directed and Descriptions of Associations: Intact  Orientation:  Full (Time, Place, and Person)  Thought Content: Logical   Suicidal Thoughts:  No  Homicidal Thoughts:  No  Memory:  Immediate;   Fair Recent;   Fair Remote;   Fair  Judgement:  Fair  Insight:  Fair  Psychomotor Activity:  Normal  Concentration:  Concentration: Fair and Attention Span: Fair  Recall:  [employer] of Knowledge: Fair  Language: Fair  Akathisia:  No  Handed:  Right  AIMS (if indicated): not done  Assets:  Manufacturing Systems Engineer Desire for Improvement Housing Social Support Transportation  ADL's:  Intact  Cognition: WNL  Sleep:  improving   Screenings: GAD-7    Loss Adjuster, Chartered Office Visit from 03/05/2024 in [facility] Counselor from 01/23/2024 in [facility] Counselor from 06/23/2023 in [facility] Counselor from 05/17/2023 in [facility] Office Visit from 05/08/2023 in [facility]  Total GAD-7 Score 20 9 6 19 21    PHQ2-9    Flowsheet Row Office Visit from 03/05/2024 in [facility] Counselor from 01/23/2024 in [facility] Office Visit from 06/26/2023 in [facility] Counselor from 06/23/2023 in [facility] Counselor from 05/17/2023 in [facility]  PHQ-2 Total Score 4 4 3 2 6   PHQ-9 Total Score 17 12 16 6 26    Flowsheet Row Video Visit from 04/03/2024 in [facility] Office Visit from 03/05/2024 in [facility] Office Visit from 01/24/2024 in [facility]   C-SSRS RISK CATEGORY No Risk No Risk No Risk     Assessment and Plan: Lindsey Morrison is a 18 year old biracial female who presented for a follow-up appointment, discussed assessment and plan as noted below.  1. GAD (generalized anxiety disorder)-improving Currently reports anxiety is improving on the current medication regimen as well as CBT. Continue Hydroxyzine  25 mg twice a day as needed Continue psychotherapy sessions with Ms. Lindsey Morrison.  I have reviewed notes per Ms. Lindsey Morrison dated 03/20/2024, currently engaged in CBT, motivational interviewing and supportive.  2. Recurrent major depressive disorder, in partial remission Currently reports depression symptoms  is improved Continue Wellbutrin  XL 150 mg daily Continue CBT  Follow-up Follow-up in clinic in 2 months or sooner if needed.  Collaboration of Care: Collaboration of Care: Referral or follow-up with counselor/therapist AEB encouraged to continue psychotherapy sessions I have reviewed notes as noted above.  Patient/Guardian was advised Release of Information must be obtained prior to any record release in order to collaborate their care with an outside provider. Patient/Guardian was advised if they have not already done so to contact the registration department to sign all necessary forms in order for us  to release information regarding their care.   Consent: Patient/Guardian gives verbal consent for treatment and assignment of benefits for services provided during this visit. Patient/Guardian expressed understanding and agreed to proceed.   This note was generated in part or whole with voice recognition software. Voice recognition is usually quite accurate but there are transcription errors that can and very often do occur. I apologize for any typographical errors that were not detected and corrected.    Etheleen Valtierra, MD 04/03/2024, 11:18 AM

## 2024-04-10 ENCOUNTER — Ambulatory Visit: Admitting: Professional Counselor

## 2024-04-10 DIAGNOSIS — F411 Generalized anxiety disorder: Secondary | ICD-10-CM

## 2024-04-10 NOTE — Progress Notes (Signed)
 THERAPIST PROGRESS NOTE  Session Time: 2:03 PM - 2:50 PM   Participation Level: Active  Behavioral Response: Casual, Alert, Anxious  Type of Therapy: Individual Therapy  Treatment Goals addressed: Active Anxiety  LTG: I want to figure out what's wrong with me and why I think the way I think. I'm very impulsive. I want to find solutions to my problems without spiraling over things. Just figuring out my brain and why I feel the way I feel.  (Progressing)                Start:  05/24/23    Expected End:  05/22/24    Goal Note Reviewed 01/23/24 I feel like that's similar to my short term goal. I can sometimes regulate my emotions, but most of the time, I'm just all over the place. I need to not be all over the place and just do better.   STG: Report a decrease in anxiety and depression symptoms as evidenced by an overall reduction in score by a minimum of 25% on the GAD-7 and PHQ-9 (Completed/Met)  Goal Note Scores have improved Revised goal is to reduce scores to mild sxs over the next 12 weeks   STG: Just trying to get a grasp of my emotions and why I'm so moody and all over the place. Improve emotional intelligence AEB ability to identify emotions and implement emotion regulation skills and cognitive restructuring over the next 12 weeks.  (Progressing)  Goal Note Reviewed 01/23/24 I feel like it's definitely better, but it's definitely not great. I feel like I communicate my emotions better than I did before, I just feel like my emotions are very intense and I don't regulate them or understand them myself.   Demonstrates progress in stages of grief at own pace (Completed/Met)  Goal Note Reviewed 01/23/24 - I feel like I've worked through some of them. I don't really think about it like I used to.  ProgressTowards Goals: Progressing  Interventions: CBT and Supportive  Summary: Lindsey Morrison is a 18 y.o. female who presents with a history of anxiety and depression. She appeared  alert and oriented x5. She reported she has started working at her [employer]. She continues to work at [employer] as well but plans to reduce her availability there. Lindsey Morrison shared updates with school and career plans. She also noted updates with school, grades, and upcoming classes. She noted plans for the holidays. Lindsey Morrison reported her rumination about death has subsided. She continues to struggle with catastrophic thinking but is managing it better than before.   Therapist Response: Conducted session with Lindsey Morrison. Began session with check-in/update since previous session. Utilized empathetic and reflective listening. Used open-ended questions to facilitate discussion and summarized Lindsey Morrison's thoughts/feelings. Highlighted some contradictions in Lindsey Morrison's thoughts vs actions. Noted ongoing catastrophizing. Encouraged Lindsey Morrison to focus on more likely situations rather than extremes. Scheduled additional appointment and concluded session.   Suicidal/Homicidal: No  Plan: Return again in 2 weeks.  Diagnosis: GAD (generalized anxiety disorder)  Collaboration of Care: Medication Management AEB chart review  Patient/Guardian was advised Release of Information must be obtained prior to any record release in order to collaborate their care with an outside provider. Patient/Guardian was advised if they have not already done so to contact the registration department to sign all necessary forms in order for us  to release information regarding their care.   Consent: Patient/Guardian gives verbal consent for treatment and assignment of benefits for services provided during this visit. Patient/Guardian expressed  understanding and agreed to proceed.   Lindsey Morrison, [facility] 04/10/2024

## 2024-04-24 ENCOUNTER — Ambulatory Visit: Admitting: Professional Counselor

## 2024-04-24 DIAGNOSIS — F411 Generalized anxiety disorder: Secondary | ICD-10-CM | POA: Diagnosis not present

## 2024-04-24 NOTE — Progress Notes (Unsigned)
 THERAPIST PROGRESS NOTE  Virtual Visit via Video Note  I connected with Stefany J Eckley on 04/24/2024 at  2:00 PM EST by a video enabled telemedicine application and verified that I am speaking with the correct person using two identifiers.  Location: Patient: Home Provider: Remote office   I discussed the limitations of evaluation and management by telemedicine and the availability of in person appointments. The patient expressed understanding and agreed to proceed.  I discussed the assessment and treatment plan with the patient. The patient was provided an opportunity to ask questions and all were answered. The patient agreed with the plan and demonstrated an understanding of the instructions.   The patient was advised to call back or seek an in-person evaluation if the symptoms worsen or if the condition fails to improve as anticipated.  I provided 46 minutes of non-face-to-face time during this encounter. [name], [facility]  Session Time: 2:01 PM - 2:47 PM   Participation Level: Active  Behavioral Response: Casual, Alert, Anxious  Type of Therapy: Individual Therapy  Treatment Goals addressed: Active Anxiety  LTG: I want to figure out what's wrong with me and why I think the way I think. I'm very impulsive. I want to find solutions to my problems without spiraling over things. Just figuring out my brain and why I feel the way I feel.  (Progressing)                Start:  05/24/23    Expected End:  05/22/24    Goal Note Reviewed 01/23/24 I feel like that's similar to my short term goal. I can sometimes regulate my emotions, but most of the time, I'm just all over the place. I need to not be all over the place and just do better.   STG: Report a decrease in anxiety and depression symptoms as evidenced by an overall reduction in score by a minimum of 25% on the GAD-7 and PHQ-9 (Completed/Met)  Goal Note Scores have improved Revised goal is to reduce scores to mild sxs  over the next 12 weeks   STG: Just trying to get a grasp of my emotions and why I'm so moody and all over the place. Improve emotional intelligence AEB ability to identify emotions and implement emotion regulation skills and cognitive restructuring over the next 12 weeks.  (Progressing)  Goal Note Reviewed 01/23/24 I feel like it's definitely better, but it's definitely not great. I feel like I communicate my emotions better than I did before, I just feel like my emotions are very intense and I don't regulate them or understand them myself.   Demonstrates progress in stages of grief at own pace (Completed/Met)  Goal Note Reviewed 01/23/24 - I feel like I've worked through some of them. I don't really think about it like I used to.  ProgressTowards Goals: Progressing  Interventions: Motivational Interviewing and Supportive  Summary: Tylesha J Remsburg is a 18 y.o. female who presents with a history of anxiety and depression. She appeared alert and oriented x5. She reported she woke up sick and requested the switch to virtual today. [name] shared updates including recent changes with employment and future career plans. She also provided updates with school, including changing her major. She feels confident in these changes.   Therapist Response: Conducted session with [name]. Began session with check-in/update since previous session. Utilized empathetic and reflective listening. Used open-ended questions to facilitate discussion and summarized [name]'s thoughts/feelings. Explored recent changes with employment and school. Scheduled  additional appointment and concluded session.   Suicidal/Homicidal: No  Plan: Return again in 3 weeks.  Diagnosis: GAD (generalized anxiety disorder)  Collaboration of Care: Medication Management AEB chart review  Patient/Guardian was advised Release of Information must be obtained prior to any record release in order to collaborate their care with an outside  provider. Patient/Guardian was advised if they have not already done so to contact the registration department to sign all necessary forms in order for us  to release information regarding their care.   Consent: Patient/Guardian gives verbal consent for treatment and assignment of benefits for services provided during this visit. Patient/Guardian expressed understanding and agreed to proceed.   [name], [facility] 04/24/2024

## 2024-05-15 ENCOUNTER — Ambulatory Visit (INDEPENDENT_AMBULATORY_CARE_PROVIDER_SITE_OTHER): Admitting: Professional Counselor

## 2024-05-15 DIAGNOSIS — F411 Generalized anxiety disorder: Secondary | ICD-10-CM | POA: Diagnosis not present

## 2024-05-15 DIAGNOSIS — F3341 Major depressive disorder, recurrent, in partial remission: Secondary | ICD-10-CM | POA: Diagnosis not present

## 2024-05-15 NOTE — Progress Notes (Signed)
 " THERAPIST PROGRESS NOTE  Session Time: 11:03 AM - 11:53 AM  Participation Level: Active  Behavioral Response: Casual, Alert, Anxious  Type of Therapy: Individual Therapy  Treatment Goals addressed:  Active Anxiety  LTG: I want to figure out what's wrong with me and why I think the way I think. I'm very impulsive. I want to find solutions to my problems without spiraling over things. Just figuring out my brain and why I feel the way I feel.  (Progressing)                Start:  05/24/23    Expected End:  05/22/24    Goal Note Reviewed 01/23/24 I feel like that's similar to my short term goal. I can sometimes regulate my emotions, but most of the time, I'm just all over the place. I need to not be all over the place and just do better.   STG: Report a decrease in anxiety and depression symptoms as evidenced by an overall reduction in score by a minimum of 25% on the GAD-7 and PHQ-9 (Completed/Met)  Goal Note Scores have improved Revised goal is to reduce scores to mild sxs over the next 12 weeks   STG: Just trying to get a grasp of my emotions and why I'm so moody and all over the place. Improve emotional intelligence AEB ability to identify emotions and implement emotion regulation skills and cognitive restructuring over the next 12 weeks.  (Progressing)  Goal Note Reviewed 01/23/24 I feel like it's definitely better, but it's definitely not great. I feel like I communicate my emotions better than I did before, I just feel like my emotions are very intense and I don't regulate them or understand them myself.   Demonstrates progress in stages of grief at own pace (Completed/Met)  Goal Note Reviewed 01/23/24 - I feel like I've worked through some of them. I don't really think about it like I used to.  ProgressTowards Goals: Progressing  Interventions: CBT, Motivational Interviewing, and Supportive  Summary: Wadie J Mawson is a 19 y.o. female who presents with a history of anxiety  and depression. She appeared alert and oriented x5. She reported there has been some more changes in her employment and career plans. 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 Lindsey Morrison reported ongoing frustration with her grandmother. She is unsure how to manage this stressor but she doesn't want to move out.   Therapist Response: Conducted session with Lindsey Morrison. Began session with check-in/update since previous session. Utilized empathetic and reflective listening. Used open-ended questions to facilitate discussion and summarized Lindsey Morrison's thoughts/feelings. Explored current stressors and potential ways to minimize stressors or emotional reaction. Scheduled additional appointment and concluded session.   Suicidal/Homicidal: No  Plan: Return again in 2 weeks.  Diagnosis: GAD (generalized anxiety disorder)  Recurrent major depressive disorder, in partial remission  Collaboration of Care: Medication Management AEB chart review  Patient/Guardian was advised Release of Information must be obtained prior to any record release in order to collaborate their care with an outside provider. Patient/Guardian was advised if they have not already done so to contact the registration department to sign all necessary forms in order for us  to release information regarding their care.   Consent: Patient/Guardian gives verbal consent for treatment and assignment of benefits for services provided during this visit. Patient/Guardian expressed understanding  and agreed to proceed.   [name], [facility] 05/15/2024  "

## 2024-05-29 ENCOUNTER — Ambulatory Visit: Admitting: Professional Counselor

## 2024-05-29 DIAGNOSIS — F3341 Major depressive disorder, recurrent, in partial remission: Secondary | ICD-10-CM | POA: Diagnosis not present

## 2024-05-29 DIAGNOSIS — F411 Generalized anxiety disorder: Secondary | ICD-10-CM | POA: Diagnosis not present

## 2024-05-29 NOTE — Progress Notes (Signed)
 " THERAPIST PROGRESS NOTE  Virtual Visit via Video Note  I connected with Lindsey Morrison on 05/29/24 at  1:00 PM EST by a video enabled telemedicine application and verified that I am speaking with the correct person using two identifiers.  Location: Patient: Lindsey Morrison (private location) Provider: Office   I discussed the limitations of evaluation and management by telemedicine and the availability of in person appointments. The patient expressed understanding and agreed to proceed.  I discussed the assessment and treatment plan with the patient. The patient was provided an opportunity to ask questions and all were answered. The patient agreed with the plan and demonstrated an understanding of the instructions.   The patient was advised to call back or seek an in-person evaluation if the symptoms worsen or if the condition fails to improve as anticipated.  I provided 32 minutes of non-face-to-face time during this encounter. Lindsey Morrison, [facility]  Session Time: 1:03 PM - 1:35 PM  Participation Level: Active  Behavioral Response: Casual, Alert, Anxious  Type of Therapy: Individual Therapy  Treatment Goals addressed: Active Anxiety  LTG: I guess stability. Just being content with where I am with my life.     Start:  05/29/24    Expected End:  05/28/25     STG: Staying consistent with keeping my stuff clean. To improve cleanliness AEB completing a cleaning routine once a week over the next 12 weeks.    STG: Maintaining my grades. I just need to stay motivated. To maintain passing grades AEB managing stressors on a daily basis and following a schoolwork routine over the next 12 weeks.    ProgressTowards Goals: Initial  Interventions: CBT, Motivational Interviewing, and Supportive  Summary: Lindsey Morrison is a 19 y.o. female who presents with a history of anxiety and depression. She appeared somber but oriented x5. She was located at her [employer] where she  has started working again. 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 She agreed she struggles with stability and following through with plans. She noted personal goals she would like to work on. [name] engaged in developing her new treatment plan. She reported getting her room in order is the highest priority. She identified a need for a bookcase and explored ways to obtain one. She was receptive to chunking to help with task completion.   Therapist Response: Conducted session with [name]. Began session with check-in/update since previous session. Utilized empathetic and reflective listening. Used open-ended questions to facilitate discussion and summarized [name]'s thoughts/feelings. Highlighted ongoing pattern of instability and lack of follow-through with self-identified plans. Assisted with identifying priorities in personal goals. Developed new treatment plan. Focused on organization goal and identified ways to work towards this goal between now and next week. Scheduled additional appointment and concluded session.   Suicidal/Homicidal: No  Plan: Return again in 2 weeks.  Diagnosis: GAD (generalized anxiety disorder)  Recurrent major depressive disorder, in partial remission  Collaboration of Care: Medication Management AEB chart review  Patient/Guardian was advised Release of Information must be obtained prior to any record release in order to collaborate their care with an outside provider. Patient/Guardian was advised if they have not already done so to contact the registration department to sign all necessary forms in order for us  to release information regarding their care.   Consent: Patient/Guardian gives verbal consent for treatment and assignment of benefits for services provided during this visit. Patient/Guardian expressed understanding and agreed to  proceed.   Lindsey Morrison, [facility] 05/29/2024  "

## 2024-05-31 ENCOUNTER — Encounter: Payer: Self-pay | Admitting: Psychiatry

## 2024-05-31 ENCOUNTER — Telehealth: Admitting: Psychiatry

## 2024-05-31 DIAGNOSIS — F325 Major depressive disorder, single episode, in full remission: Secondary | ICD-10-CM | POA: Diagnosis not present

## 2024-05-31 DIAGNOSIS — F411 Generalized anxiety disorder: Secondary | ICD-10-CM

## 2024-05-31 NOTE — Progress Notes (Signed)
 Virtual Visit via Video Note  I connected with Lindsey Morrison on 05/31/24 at 10:40 AM EST by a video enabled telemedicine application and verified that I am speaking with the correct person using two identifiers.  Location Provider Location : [facility] Patient Location : Work  Participants: Patient , Provider   I discussed the limitations of evaluation and management by telemedicine and the availability of in person appointments. The patient expressed understanding and agreed to proceed.   I discussed the assessment and treatment plan with the patient. The patient was provided an opportunity to ask questions and all were answered. The patient agreed with the plan and demonstrated an understanding of the instructions.   The patient was advised to call back or seek an in-person evaluation if the symptoms worsen or if the condition fails to improve as anticipated.  BH MD OP Progress Note  05/31/2024 10:57 AM Lindsey Morrison  MRN:  [id number]  Chief Complaint:  Chief Complaint  Patient presents with   Follow-up   Anxiety   Depression   Medication Refill   Discussed the use of AI scribe software for clinical note transcription with the patient, who gave verbal consent to proceed.  History of Present Illness Lindsey Morrison is a 19 year old biracial female, single, has a history of GAD, depression, asthma, menstrual irregularities was evaluated by telemedicine today for a follow-up appointment.  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 The first few weeks of the new semester felt challenging, but she has since established a routine and feels steady with her schedule. She denies experiencing significant sadness, lack of motivation, or feelings of being down, depressed, or hopeless in the past couple of weeks. She also denies suicidal thoughts, thoughts about hurting  others, and hallucinations.  She reports sleep is overall good.  She continues to take Wellbutrin  150 mg in the morning and expressed concern about potential medication interactions with montelukast.  She denies any side effects.  She continues to be engaged in psychotherapy with Ms. Veva and reports therapy sessions is beneficial.    Visit Diagnosis:    ICD-10-CM   1. GAD (generalized anxiety disorder)  F41.1     2. Major depressive disorder with single episode, in full remission  F32.5       Past Psychiatric History: I have reviewed past psychiatric history from progress note on 05/08/2023.  Past trials of medications like Zoloft-made her worse, Prozac, Lexapro, trazodone, Effexor -noncompliant.  Past Medical History:  Past Medical History:  Diagnosis Date   Ankle fracture, right    Anxiety    Asthma    Depression    History reviewed. No pertinent surgical history.  Family Psychiatric History: I have reviewed family psychiatric history from progress note on 05/08/2023.  Family History:  Family History  Problem Relation Age of Onset   Depression Mother    Anxiety disorder Mother    Drug abuse Mother    Drug abuse Father    Drug abuse Maternal Uncle    Suicidality Maternal Uncle    Schizophrenia Other     Social History: I have reviewed social history from progress note on 05/08/2023. Social History   Socioeconomic History   Marital status: Single    Spouse name: Not on file   Number of children: Not on file   Years of education: Not on file   Highest education level: High school  graduate  Occupational History   Not on file  Tobacco Use   Smoking status: Never   Smokeless tobacco: Never  Vaping Use   Vaping status: Every Day   Substances: Nicotine, Flavoring  Substance and Sexual Activity   Alcohol use: Not Currently   Drug use: Yes    Types: Marijuana   Sexual activity: Yes    Birth control/protection: Pill, Condom  Other Topics Concern   Not on file   Social History Narrative   Not on file   Social Drivers of Health   Tobacco Use: Low Risk (05/31/2024)   Patient History    Smoking Tobacco Use: Never    Smokeless Tobacco Use: Never    Passive Exposure: Not on file  Financial Resource Strain: Patient Declined (02/21/2024)   Received from [facility]   Overall Financial Resource Strain (CARDIA)    Difficulty of Paying Living Expenses: Patient declined  Food Insecurity: Patient Declined (02/21/2024)   Received from [facility]   Epic    Within the past 12 months, you worried that your food would run out before you got the money to buy more.: Patient declined    Within the past 12 months, the food you bought just didn't last and you didn't have money to get more.: Patient declined  Transportation Needs: Patient Declined (02/21/2024)   Received from [facility] - Transportation    In the past 12 months, has lack of transportation kept you from medical appointments or from getting medications?: Patient declined    Lack of Transportation (Non-Medical): Patient declined  Physical Activity: Sufficiently Active (05/17/2023)   Exercise Vital Sign    Days of Exercise per Week: 3 days    Minutes of Exercise per Session: 60 min  Stress: Stress Concern Present (05/17/2023)   [employer] of Occupational [employer] - Occupational Stress Questionnaire    Feeling of Stress : To some extent  Social Connections: Moderately Isolated (05/17/2023)   Social Connection and Isolation Panel    Frequency of Communication with Friends and Family: More than three times a week    Frequency of Social Gatherings with Friends and Family: More than three times a week    Attends Religious Services: 1 to 4 times per year    Active Member of Clubs or Organizations: No    Attends Banker Meetings: Never    Marital Status: Never married  Depression (PHQ2-9): Low Risk (05/31/2024)   Depression  (PHQ2-9)    PHQ-2 Score: 0  Recent Concern: Depression (PHQ2-9) - High Risk (03/05/2024)   Depression (PHQ2-9)    PHQ-2 Score: 17  Alcohol Screen: Low Risk (05/17/2023)   Alcohol Screen    Last Alcohol Screening Score (AUDIT): 1  Housing: Patient Declined (02/21/2024)   Received from [facility]   Epic    In the last 12 months, was there a time when you were not able to pay the mortgage or rent on time?: Patient declined    In the past 12 months, how many times have you moved where you were living?: 0    At any time in the past 12 months, were you homeless or living in a shelter (including now)?: Patient declined  Utilities: Patient Declined (02/21/2024)   Received from [facility]   Epic    In the past 12 months has the electric, gas, oil, or water company threatened to shut off services in your home?:  Patient declined  Health Literacy: Adequate Health Literacy (05/17/2023)   B1300 Health Literacy    Frequency of need for help with medical instructions: Rarely    Allergies: Allergies[1]  Metabolic Disorder Labs: No results found for: HGBA1C, MPG No results found for: PROLACTIN No results found for: CHOL, TRIG, HDL, CHOLHDL, VLDL, LDLCALC No results found for: TSH  Therapeutic Level Labs: No results found for: LITHIUM No results found for: VALPROATE No results found for: CBMZ  Current Medications: Current Outpatient Medications  Medication Sig Dispense Refill   albuterol (VENTOLIN HFA) 108 (90 Base) MCG/ACT inhaler Inhale into the lungs.     buPROPion  (WELLBUTRIN  XL) 150 MG 24 hr tablet Take 1 tablet (150 mg total) by mouth in the morning. 30 tablet 1   cetirizine (ZYRTEC) 10 MG tablet Take 10 mg by mouth daily.     fluticasone (FLONASE) 50 MCG/ACT nasal spray Place into the nose.     hydrOXYzine  (ATARAX ) 25 MG tablet Take 1 tablet (25 mg total) by mouth 2 (two) times daily as needed. For severe anxiety attacks ,  please limit use 60 tablet 1   montelukast (SINGULAIR) 10 MG tablet Take 10 mg by mouth at bedtime.     sodium fluoride  (FLUORISHIELD) 1.1 % GEL dental gel BRUSH ONCE DAILY IN PLACE OF REGULAR TOOTHPASTE     No current facility-administered medications for this visit.     Musculoskeletal: Strength & Muscle Tone: UTA Gait & Station: Seated Patient leans: N/A  Psychiatric Specialty Exam: Review of Systems  Psychiatric/Behavioral: Negative.      There were no vitals taken for this visit.There is no height or weight on file to calculate BMI.  General Appearance: Casual  Eye Contact:  Fair  Speech:  Clear and Coherent  Volume:  Normal  Mood:  Euthymic  Affect:  Congruent  Thought Process:  Goal Directed and Descriptions of Associations: Intact  Orientation:  Full (Time, Place, and Person)  Thought Content: Logical   Suicidal Thoughts:  No  Homicidal Thoughts:  No  Memory:  Immediate;   Fair Recent;   Fair Remote;   Fair  Judgement:  Fair  Insight:  Fair  Psychomotor Activity:  Normal  Concentration:  Concentration: Fair and Attention Span: Fair  Recall:  [employer] of Knowledge: Fair  Language: Fair  Akathisia:  No  Handed:  Right  AIMS (if indicated): not done  Assets:  Communication Skills Desire for Improvement Housing Social Support Talents/Skills Transportation  ADL's:  Intact  Cognition: WNL  Sleep:  Fair   Screenings: GAD-7    Garment/textile Technologist Visit from 03/05/2024 in [facility] Counselor from 01/23/2024 in [facility] Counselor from 06/23/2023 in [facility] Counselor from 05/17/2023 in [facility] Office Visit from 05/08/2023 in [facility]  Total GAD-7 Score 20 9 6 19 21    PHQ2-9    Flowsheet Row Video Visit from 05/31/2024 in [facility] Office Visit from 03/05/2024 in [facility] Counselor from 01/23/2024 in [facility] Office Visit from 06/26/2023 in [facility] Counselor from 06/23/2023 in [facility]  PHQ-2 Total Score 0 4 4 3 2   PHQ-9 Total Score -- 17 12 16 6    Flowsheet Row Video Visit from 05/31/2024 in [facility] Video Visit from 04/03/2024 in [facility] Office Visit from 03/05/2024 in [facility]  C-SSRS RISK CATEGORY No Risk No Risk No Risk     Assessment and Plan: Lindsey Morrison is a 19 year old biracial female who presented for a follow-up appointment, discussed assessment and noted below.  1. GAD (generalized anxiety disorder)-stable Currently denies any significant anxiety symptoms Continue CBT with Ms. Veva Continue Hydroxyzine  25 mg twice a day as needed  2. Major depressive disorder with single episode, in full remission Currently reports depression is well-managed Continue Wellbutrin  XL 150 mg daily Continue CBT  Follow-up Follow-up in clinic in 3 months or sooner in person.  Collaboration of Care: Collaboration of Care: Referral or follow-up with counselor/therapist AEB encouraged to continue psychotherapy sessions with Ms. Veva I have reviewed notes dated 05/29/2024.  Patient/Guardian was advised Release of Information must be obtained prior to any record release in order to collaborate their care with an outside provider. Patient/Guardian was advised if they have not already done so to contact the registration department to sign all necessary forms in order for us  to release information regarding their care.   Consent: Patient/Guardian gives verbal consent for treatment and assignment  of benefits for services provided during this visit. Patient/Guardian expressed understanding and agreed to proceed.  This note was generated in part or whole with voice recognition software. Voice recognition is usually quite accurate but there are transcription errors that can and very often do occur. I apologize for any typographical errors that were not detected and corrected.     Dalonda Simoni, MD 05/31/2024, 10:57 AM     [1]  Allergies Allergen Reactions   Cat Dander Shortness Of Breath   Amoxicillin Rash

## 2024-06-12 ENCOUNTER — Ambulatory Visit: Admitting: Professional Counselor

## 2024-06-13 ENCOUNTER — Ambulatory Visit: Admitting: Professional Counselor

## 2024-06-13 DIAGNOSIS — F411 Generalized anxiety disorder: Secondary | ICD-10-CM

## 2024-06-13 NOTE — Progress Notes (Unsigned)
" °  THERAPIST PROGRESS NOTE   Virtual Visit via Video Note  I connected with Lindsey Morrison on 06/13/24 at 11:00 AM EST by a video enabled telemedicine application and verified that I am speaking with the correct person using two identifiers.  Location: Patient: Home Provider: Remote office   I discussed the limitations of evaluation and management by telemedicine and the availability of in person appointments. The patient expressed understanding and agreed to proceed.  I discussed the assessment and treatment plan with the patient. The patient was provided an opportunity to ask questions and all were answered. The patient agreed with the plan and demonstrated an understanding of the instructions.   The patient was advised to call back or seek an in-person evaluation if the symptoms worsen or if the condition fails to improve as anticipated.  I provided 48 minutes of non-face-to-face time during this encounter. Lindsey Morrison, [facility]  Session Time: 11:00 AM - 11:48 AM  Participation Level: Active  Behavioral Response: Casual, Alert, Anxious  Type of Therapy: Individual Therapy  Treatment Goals addressed: Active Anxiety  LTG: I guess stability. Just being content with where I am with my life.                 Start:  05/29/24    Expected End:  05/28/25      STG: Staying consistent with keeping my stuff clean. To improve cleanliness AEB completing a cleaning routine once a week over the next 12 weeks.     STG: Maintaining my grades. I just need to stay motivated. To maintain passing grades AEB managing stressors on a daily basis and following a schoolwork routine over the next 12 weeks.    ProgressTowards Goals: Progressing  Interventions: Supportive  Summary: Lindsey Morrison is a 19 y.o. female who presents with ***.   Therapist Response: Conducted session with . Began session with check-in/update since previous session. Utilized empathetic and reflective listening.  Used open-ended questions to facilitate discussion and summarized thoughts/feelings. Scheduled additional appointment and concluded session.   Suicidal/Homicidal: No  Plan: Return again in *** weeks.  Diagnosis: GAD (generalized anxiety disorder)  Collaboration of Care: Medication Management AEB chart review  Patient/Guardian was advised Release of Information must be obtained prior to any record release in order to collaborate their care with an outside provider. Patient/Guardian was advised if they have not already done so to contact the registration department to sign all necessary forms in order for us  to release information regarding their care.   Consent: Patient/Guardian gives verbal consent for treatment and assignment of benefits for services provided during this visit. Patient/Guardian expressed understanding and agreed to proceed.   Lindsey Morrison, [facility] 06/13/2024  "

## 2024-06-28 ENCOUNTER — Ambulatory Visit: Admitting: Professional Counselor

## 2024-08-29 ENCOUNTER — Ambulatory Visit: Admitting: Psychiatry
